# Patient Record
Sex: Female | Born: 1955 | Race: White | Hispanic: No | Smoking: Former smoker
Health system: Southern US, Community
[De-identification: ages and names within clinical notes are randomized; demographics above are authoritative.]

## PROBLEM LIST (undated history)

## (undated) DIAGNOSIS — M1990 Unspecified osteoarthritis, unspecified site: Secondary | ICD-10-CM

## (undated) DIAGNOSIS — F32A Depression, unspecified: Secondary | ICD-10-CM

## (undated) DIAGNOSIS — F419 Anxiety disorder, unspecified: Secondary | ICD-10-CM

## (undated) DIAGNOSIS — C801 Malignant (primary) neoplasm, unspecified: Secondary | ICD-10-CM

## (undated) DIAGNOSIS — Z8669 Personal history of other diseases of the nervous system and sense organs: Secondary | ICD-10-CM

## (undated) DIAGNOSIS — F329 Major depressive disorder, single episode, unspecified: Secondary | ICD-10-CM

## (undated) HISTORY — PX: APPENDECTOMY: SHX54

## (undated) HISTORY — PX: CATARACT EXTRACTION, BILATERAL: SHX1313

## (undated) HISTORY — DX: Major depressive disorder, single episode, unspecified: F32.9

## (undated) HISTORY — DX: Malignant (primary) neoplasm, unspecified: C80.1

## (undated) HISTORY — PX: KIDNEY SURGERY: SHX687

## (undated) HISTORY — DX: Depression, unspecified: F32.A

## (undated) HISTORY — PX: BREAST LUMPECTOMY: SHX2

---

## 1999-02-07 ENCOUNTER — Other Ambulatory Visit: Admission: RE | Admit: 1999-02-07 | Discharge: 1999-02-07 | Payer: Self-pay | Admitting: General Surgery

## 1999-05-29 ENCOUNTER — Ambulatory Visit (HOSPITAL_COMMUNITY): Admission: RE | Admit: 1999-05-29 | Discharge: 1999-05-29 | Payer: Self-pay | Admitting: General Surgery

## 1999-05-29 ENCOUNTER — Encounter (HOSPITAL_BASED_OUTPATIENT_CLINIC_OR_DEPARTMENT_OTHER): Payer: Self-pay | Admitting: General Surgery

## 1999-07-03 DIAGNOSIS — C801 Malignant (primary) neoplasm, unspecified: Secondary | ICD-10-CM

## 1999-07-03 HISTORY — DX: Malignant (primary) neoplasm, unspecified: C80.1

## 1999-07-03 HISTORY — PX: BREAST SURGERY: SHX581

## 1999-07-24 ENCOUNTER — Other Ambulatory Visit: Admission: RE | Admit: 1999-07-24 | Discharge: 1999-07-24 | Payer: Self-pay | Admitting: Obstetrics and Gynecology

## 1999-07-25 ENCOUNTER — Other Ambulatory Visit: Admission: RE | Admit: 1999-07-25 | Discharge: 1999-07-25 | Payer: Self-pay | Admitting: Obstetrics and Gynecology

## 1999-07-25 ENCOUNTER — Encounter (INDEPENDENT_AMBULATORY_CARE_PROVIDER_SITE_OTHER): Payer: Self-pay | Admitting: Specialist

## 2000-04-01 ENCOUNTER — Other Ambulatory Visit: Admission: RE | Admit: 2000-04-01 | Discharge: 2000-04-01 | Payer: Self-pay | Admitting: General Surgery

## 2000-04-01 ENCOUNTER — Encounter (HOSPITAL_BASED_OUTPATIENT_CLINIC_OR_DEPARTMENT_OTHER): Payer: Self-pay | Admitting: General Surgery

## 2000-04-01 ENCOUNTER — Encounter (INDEPENDENT_AMBULATORY_CARE_PROVIDER_SITE_OTHER): Payer: Self-pay | Admitting: *Deleted

## 2000-04-01 ENCOUNTER — Encounter: Admission: RE | Admit: 2000-04-01 | Discharge: 2000-04-01 | Payer: Self-pay | Admitting: General Surgery

## 2000-04-03 DIAGNOSIS — C50811 Malignant neoplasm of overlapping sites of right female breast: Secondary | ICD-10-CM | POA: Insufficient documentation

## 2000-04-08 ENCOUNTER — Encounter (HOSPITAL_BASED_OUTPATIENT_CLINIC_OR_DEPARTMENT_OTHER): Payer: Self-pay | Admitting: General Surgery

## 2000-04-09 ENCOUNTER — Encounter (INDEPENDENT_AMBULATORY_CARE_PROVIDER_SITE_OTHER): Payer: Self-pay | Admitting: Specialist

## 2000-04-09 ENCOUNTER — Ambulatory Visit (HOSPITAL_COMMUNITY): Admission: RE | Admit: 2000-04-09 | Discharge: 2000-04-09 | Payer: Self-pay | Admitting: General Surgery

## 2000-04-09 ENCOUNTER — Encounter (HOSPITAL_BASED_OUTPATIENT_CLINIC_OR_DEPARTMENT_OTHER): Payer: Self-pay | Admitting: General Surgery

## 2000-04-30 ENCOUNTER — Ambulatory Visit (HOSPITAL_COMMUNITY): Admission: RE | Admit: 2000-04-30 | Discharge: 2000-04-30 | Payer: Self-pay | Admitting: General Surgery

## 2000-04-30 ENCOUNTER — Encounter (INDEPENDENT_AMBULATORY_CARE_PROVIDER_SITE_OTHER): Payer: Self-pay | Admitting: *Deleted

## 2000-07-04 ENCOUNTER — Encounter: Admission: RE | Admit: 2000-07-04 | Discharge: 2000-10-02 | Payer: Self-pay | Admitting: *Deleted

## 2000-09-11 ENCOUNTER — Other Ambulatory Visit: Admission: RE | Admit: 2000-09-11 | Discharge: 2000-09-11 | Payer: Self-pay | Admitting: Obstetrics and Gynecology

## 2000-10-08 ENCOUNTER — Other Ambulatory Visit: Admission: RE | Admit: 2000-10-08 | Discharge: 2000-10-08 | Payer: Self-pay | Admitting: General Surgery

## 2000-11-26 ENCOUNTER — Other Ambulatory Visit: Admission: RE | Admit: 2000-11-26 | Discharge: 2000-11-26 | Payer: Self-pay | Admitting: Obstetrics and Gynecology

## 2000-11-26 ENCOUNTER — Encounter (INDEPENDENT_AMBULATORY_CARE_PROVIDER_SITE_OTHER): Payer: Self-pay | Admitting: Specialist

## 2001-01-01 ENCOUNTER — Encounter (INDEPENDENT_AMBULATORY_CARE_PROVIDER_SITE_OTHER): Payer: Self-pay | Admitting: Specialist

## 2001-01-01 ENCOUNTER — Ambulatory Visit (HOSPITAL_COMMUNITY): Admission: RE | Admit: 2001-01-01 | Discharge: 2001-01-01 | Payer: Self-pay | Admitting: Obstetrics and Gynecology

## 2001-04-03 ENCOUNTER — Encounter: Payer: Self-pay | Admitting: Oncology

## 2001-04-03 ENCOUNTER — Encounter: Admission: RE | Admit: 2001-04-03 | Discharge: 2001-04-03 | Payer: Self-pay | Admitting: Oncology

## 2001-06-12 ENCOUNTER — Other Ambulatory Visit: Admission: RE | Admit: 2001-06-12 | Discharge: 2001-06-12 | Payer: Self-pay | Admitting: Family Medicine

## 2001-06-30 ENCOUNTER — Encounter: Admission: RE | Admit: 2001-06-30 | Discharge: 2001-06-30 | Payer: Self-pay | Admitting: General Surgery

## 2001-06-30 ENCOUNTER — Encounter (HOSPITAL_BASED_OUTPATIENT_CLINIC_OR_DEPARTMENT_OTHER): Payer: Self-pay | Admitting: General Surgery

## 2001-06-30 ENCOUNTER — Encounter (INDEPENDENT_AMBULATORY_CARE_PROVIDER_SITE_OTHER): Payer: Self-pay | Admitting: *Deleted

## 2001-07-21 ENCOUNTER — Encounter: Admission: RE | Admit: 2001-07-21 | Discharge: 2001-07-21 | Payer: Self-pay | Admitting: General Surgery

## 2001-07-21 ENCOUNTER — Encounter (HOSPITAL_BASED_OUTPATIENT_CLINIC_OR_DEPARTMENT_OTHER): Payer: Self-pay | Admitting: General Surgery

## 2001-08-18 ENCOUNTER — Encounter (HOSPITAL_BASED_OUTPATIENT_CLINIC_OR_DEPARTMENT_OTHER): Payer: Self-pay | Admitting: General Surgery

## 2001-08-18 ENCOUNTER — Encounter: Admission: RE | Admit: 2001-08-18 | Discharge: 2001-08-18 | Payer: Self-pay | Admitting: General Surgery

## 2002-04-07 ENCOUNTER — Encounter: Payer: Self-pay | Admitting: Oncology

## 2002-04-07 ENCOUNTER — Encounter: Admission: RE | Admit: 2002-04-07 | Discharge: 2002-04-07 | Payer: Self-pay | Admitting: Oncology

## 2002-11-16 ENCOUNTER — Ambulatory Visit (HOSPITAL_COMMUNITY): Admission: RE | Admit: 2002-11-16 | Discharge: 2002-11-16 | Payer: Self-pay

## 2003-03-12 ENCOUNTER — Encounter: Admission: RE | Admit: 2003-03-12 | Discharge: 2003-03-12 | Payer: Self-pay | Admitting: Oncology

## 2003-03-19 ENCOUNTER — Encounter: Payer: Self-pay | Admitting: Oncology

## 2003-03-19 ENCOUNTER — Encounter: Admission: RE | Admit: 2003-03-19 | Discharge: 2003-03-19 | Payer: Self-pay | Admitting: Oncology

## 2003-03-30 ENCOUNTER — Encounter: Payer: Self-pay | Admitting: Radiation Oncology

## 2003-03-30 ENCOUNTER — Ambulatory Visit (HOSPITAL_COMMUNITY): Admission: RE | Admit: 2003-03-30 | Discharge: 2003-03-30 | Payer: Self-pay | Admitting: Radiation Oncology

## 2003-04-09 ENCOUNTER — Encounter: Admission: RE | Admit: 2003-04-09 | Discharge: 2003-04-09 | Payer: Self-pay | Admitting: Oncology

## 2003-04-09 ENCOUNTER — Encounter: Payer: Self-pay | Admitting: Oncology

## 2003-06-30 ENCOUNTER — Other Ambulatory Visit: Admission: RE | Admit: 2003-06-30 | Discharge: 2003-06-30 | Payer: Self-pay | Admitting: Obstetrics and Gynecology

## 2003-07-03 HISTORY — PX: ABDOMINAL HYSTERECTOMY: SHX81

## 2003-07-03 HISTORY — PX: OOPHORECTOMY: SHX86

## 2004-03-30 ENCOUNTER — Ambulatory Visit: Admission: RE | Admit: 2004-03-30 | Discharge: 2004-03-30 | Payer: Self-pay | Admitting: Radiation Oncology

## 2004-04-10 ENCOUNTER — Encounter: Admission: RE | Admit: 2004-04-10 | Discharge: 2004-04-10 | Payer: Self-pay | Admitting: Oncology

## 2004-04-11 ENCOUNTER — Ambulatory Visit (HOSPITAL_COMMUNITY): Admission: RE | Admit: 2004-04-11 | Discharge: 2004-04-11 | Payer: Self-pay | Admitting: Oncology

## 2004-04-14 ENCOUNTER — Encounter: Admission: RE | Admit: 2004-04-14 | Discharge: 2004-04-14 | Payer: Self-pay | Admitting: Oncology

## 2004-04-19 ENCOUNTER — Ambulatory Visit (HOSPITAL_COMMUNITY): Admission: RE | Admit: 2004-04-19 | Discharge: 2004-04-19 | Payer: Self-pay | Admitting: Oncology

## 2004-04-20 ENCOUNTER — Emergency Department (HOSPITAL_COMMUNITY): Admission: EM | Admit: 2004-04-20 | Discharge: 2004-04-20 | Payer: Self-pay | Admitting: Emergency Medicine

## 2004-04-28 ENCOUNTER — Ambulatory Visit (HOSPITAL_COMMUNITY): Admission: RE | Admit: 2004-04-28 | Discharge: 2004-04-28 | Payer: Self-pay | Admitting: Oncology

## 2004-05-05 ENCOUNTER — Encounter (INDEPENDENT_AMBULATORY_CARE_PROVIDER_SITE_OTHER): Payer: Self-pay | Admitting: *Deleted

## 2004-05-05 ENCOUNTER — Ambulatory Visit (HOSPITAL_COMMUNITY): Admission: RE | Admit: 2004-05-05 | Discharge: 2004-05-05 | Payer: Self-pay | Admitting: Oncology

## 2004-05-12 ENCOUNTER — Ambulatory Visit: Payer: Self-pay | Admitting: Oncology

## 2004-05-17 ENCOUNTER — Ambulatory Visit: Admission: RE | Admit: 2004-05-17 | Discharge: 2004-07-07 | Payer: Self-pay | Admitting: Radiation Oncology

## 2004-08-23 ENCOUNTER — Ambulatory Visit: Payer: Self-pay | Admitting: Oncology

## 2004-10-18 ENCOUNTER — Ambulatory Visit: Payer: Self-pay | Admitting: Oncology

## 2005-04-11 ENCOUNTER — Encounter: Admission: RE | Admit: 2005-04-11 | Discharge: 2005-04-11 | Payer: Self-pay | Admitting: Oncology

## 2005-04-16 ENCOUNTER — Ambulatory Visit: Payer: Self-pay | Admitting: Oncology

## 2005-05-15 ENCOUNTER — Ambulatory Visit (HOSPITAL_COMMUNITY): Admission: RE | Admit: 2005-05-15 | Discharge: 2005-05-15 | Payer: Self-pay | Admitting: Oncology

## 2005-06-01 ENCOUNTER — Ambulatory Visit: Payer: Self-pay | Admitting: Oncology

## 2005-07-17 ENCOUNTER — Encounter: Admission: RE | Admit: 2005-07-17 | Discharge: 2005-07-17 | Payer: Self-pay | Admitting: Oncology

## 2005-10-11 ENCOUNTER — Ambulatory Visit: Payer: Self-pay | Admitting: Oncology

## 2006-01-05 IMAGING — CR DG CHEST 2V
2 series · 2 of 2 positions shown · non-contrast
Comparison: none

CLINICAL DATA: Mid chest pain extending into the back.  History of carcinoma of the breast diagnosed 4 years ago. 
 TWO VIEW CHEST
 PA and lateral views of the chest are made and are compared to previous studies of 04/11/04 and show generalized peribronchial thickening and some hyperaeration of the lungs consistent with chronic obstructive pulmonary disease.  There is no pneumothorax or pleural effusion.  The bones show no evidence of fracture or metastatic disease.  The heart and mediastinum are normal. 
 IMPRESSION
 Mild COPD.  No significant interval change.  No acute disease.

[view not recorded (1 of 2)]
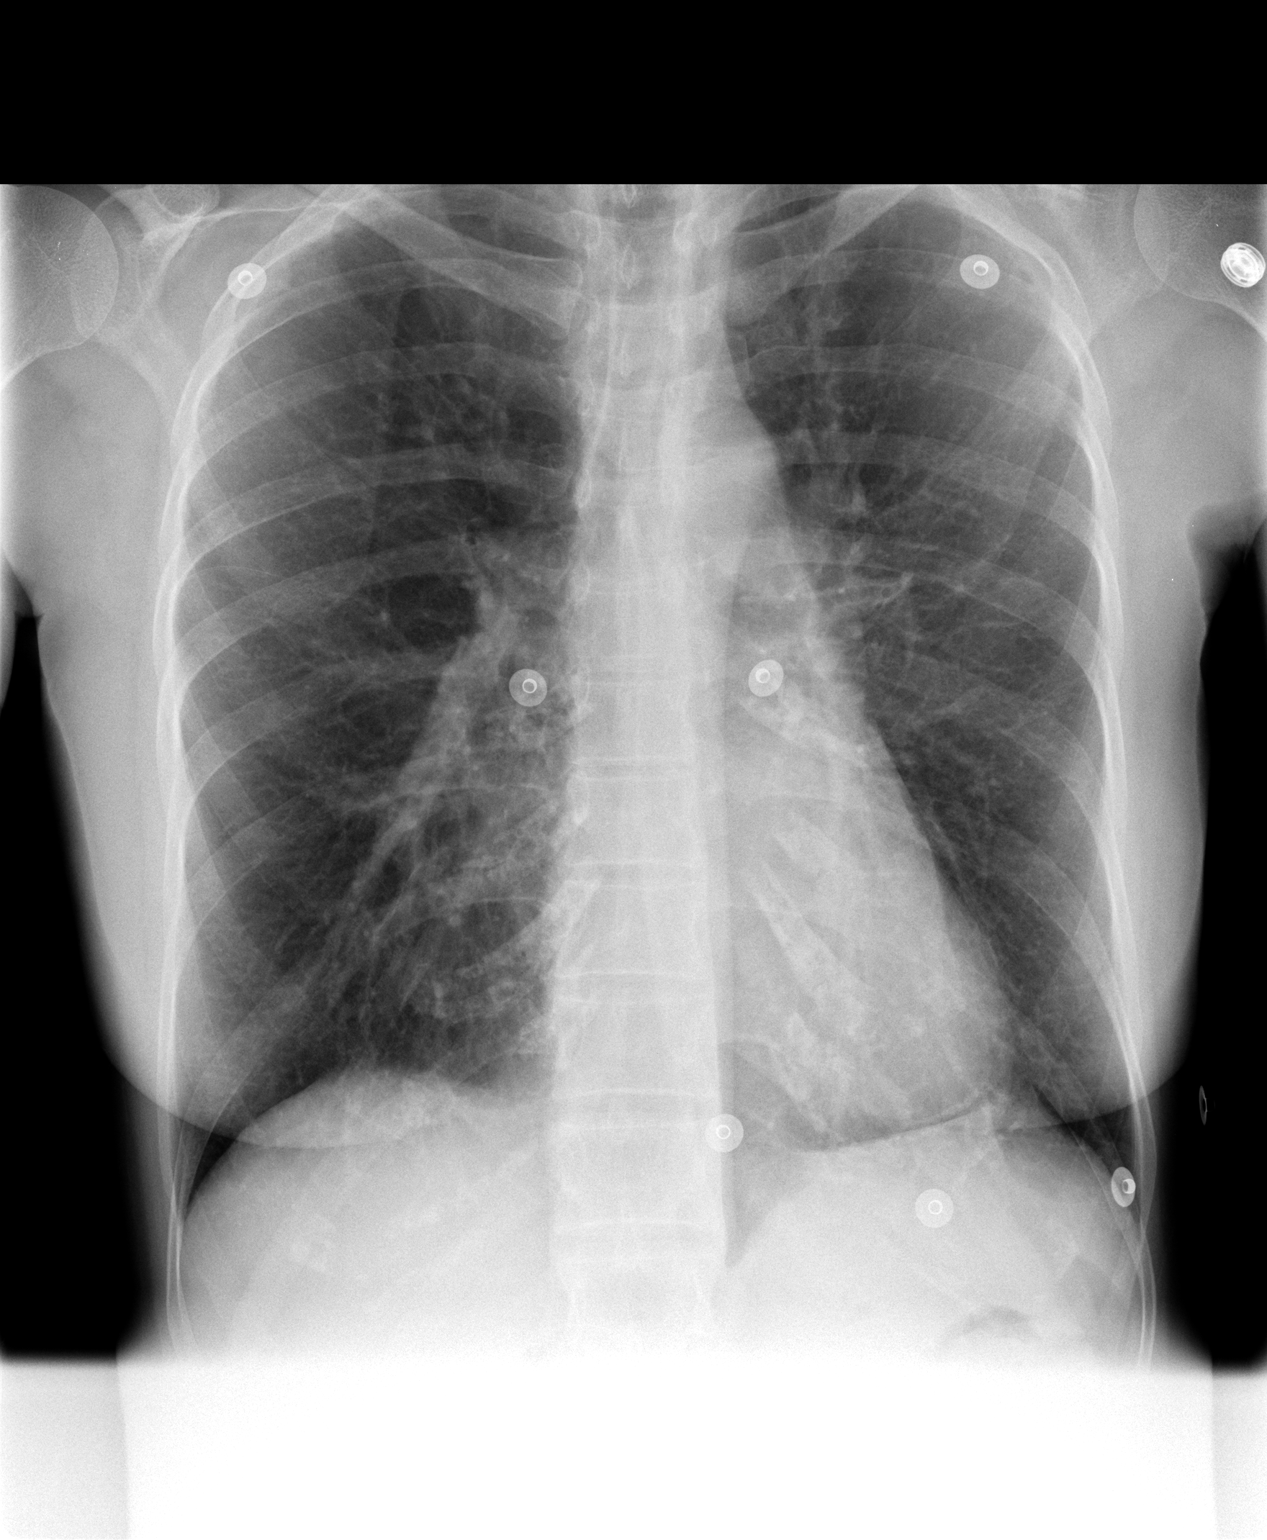

[view not recorded (2 of 2)]
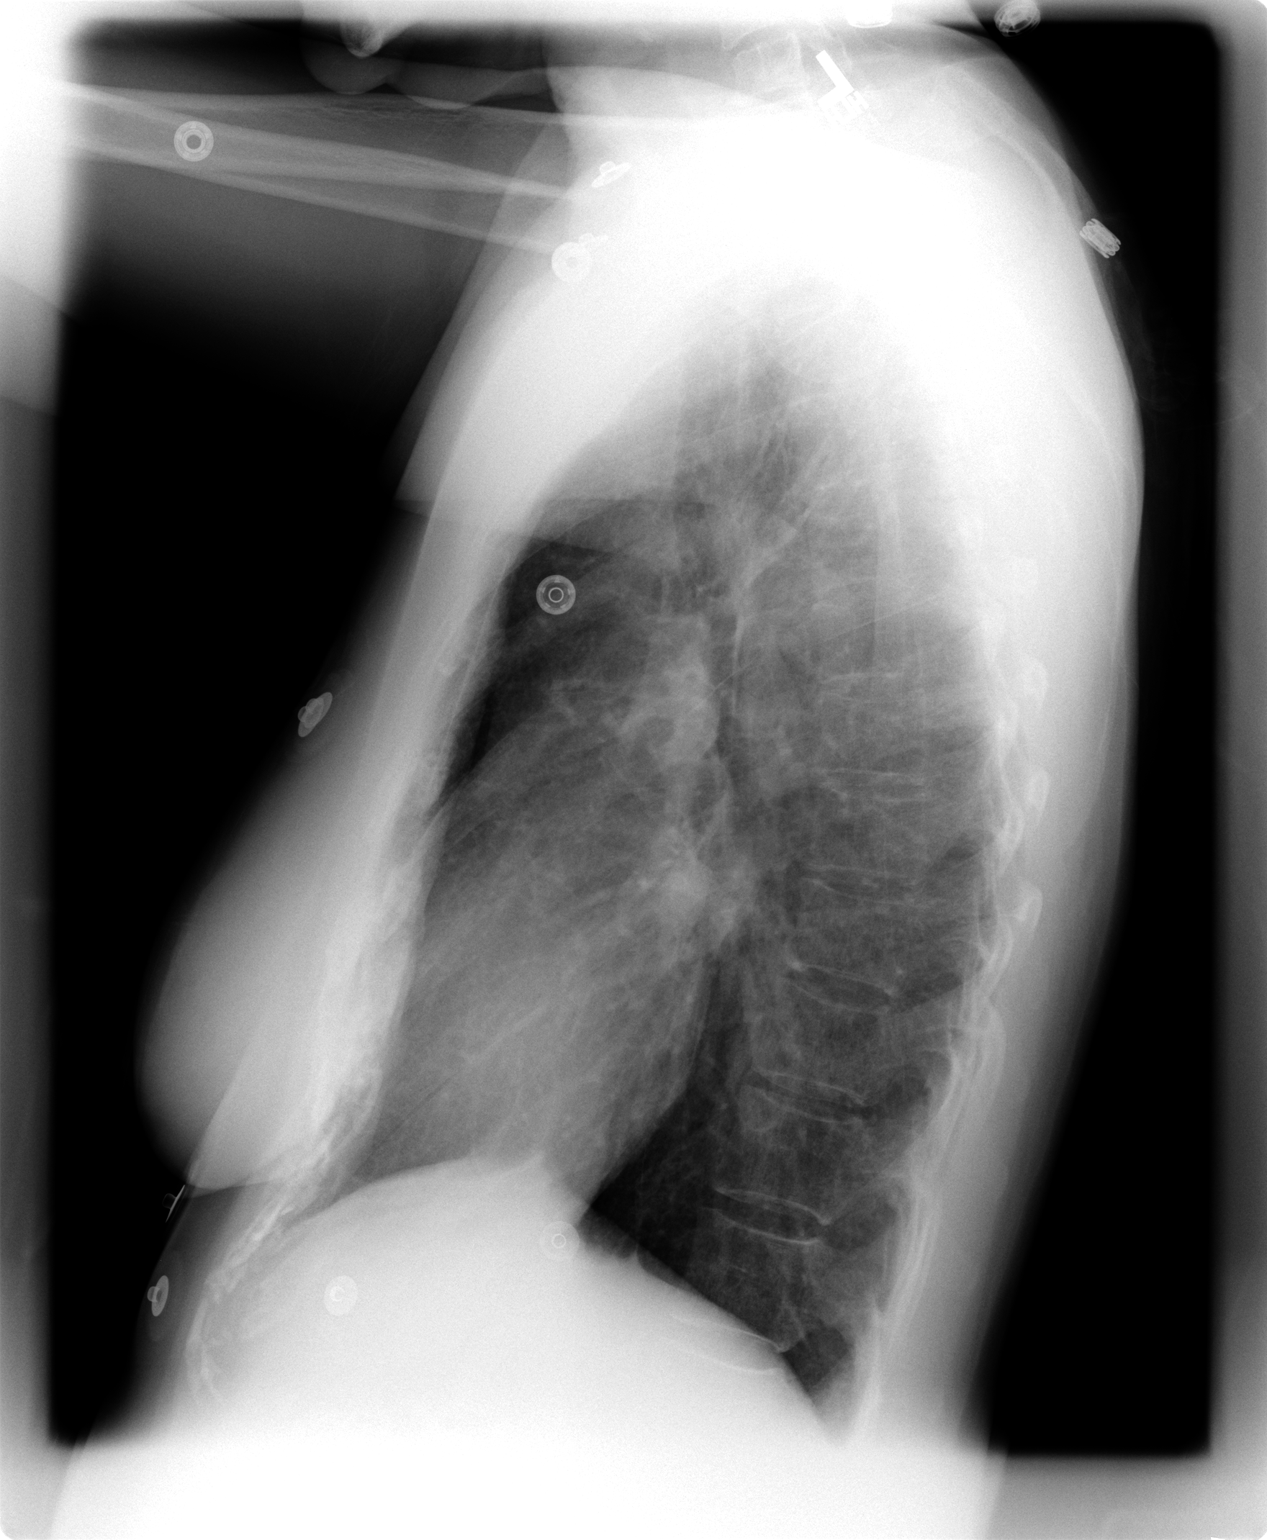

[2 of 2 positions shown; findings below may reference images not displayed]

## 2006-01-09 ENCOUNTER — Ambulatory Visit (HOSPITAL_COMMUNITY): Admission: RE | Admit: 2006-01-09 | Discharge: 2006-01-10 | Payer: Self-pay | Admitting: Obstetrics and Gynecology

## 2006-01-09 ENCOUNTER — Encounter (INDEPENDENT_AMBULATORY_CARE_PROVIDER_SITE_OTHER): Payer: Self-pay | Admitting: Specialist

## 2006-01-13 IMAGING — CT NM PET TUM IMG SKULL BASE T - THIGH
4 series · 25 of 25 positions shown · IV contrast ([ID])
Comparison: No prior PET-CT. The whole-body bone scan 04/14/2004 and the CT chest 04/19/2004 is
correlated.

CLINICAL DATA: History of breast cancer originally diagnosed in 5993, status post lumpectomy and
radiation therapy and chemotherapy at that time. Patient has one month history of left rib pain and
had a bone scan and chest CT suggesting a sclerotic metastasis in the anterior left third rib.
Restaging.

FDG PET-CT TUMOR IMAGING (SKULL BASE TO THIGHS)  04/28/2004:
Fasting Blood Glucose:  136
TECHNIQUE: 17.6 mCi F-18 FDG was injected intravenously via the right antecubital vein . 
Full-ring PET imaging was performed from the skull base through the mid-thighs 40 minutes after
injection.  CT data was obtained and used for attenuation correction and anatomic localization
only.  (This was not acquired as a diagnostic CT examination.)

[Series 1: pet ac · axial · 3.3mm · 4.69mm/px · z∈[-873,-3]mm · 8 of 267 slices shown]
[im 1/267]
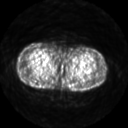
[im 39/267]
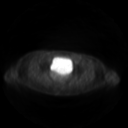
[im 77/267]
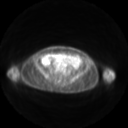
[im 115/267]
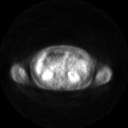
[im 153/267]
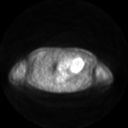
[im 191/267]
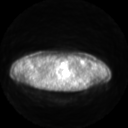
[im 229/267]
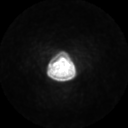
[im 267/267]
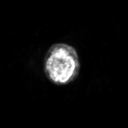

[Series 2: ct images · axial · 3.8mm · 0.98mm/px · z∈[-873,-4]mm · 8 of 267 slices shown]
[im 1/267]
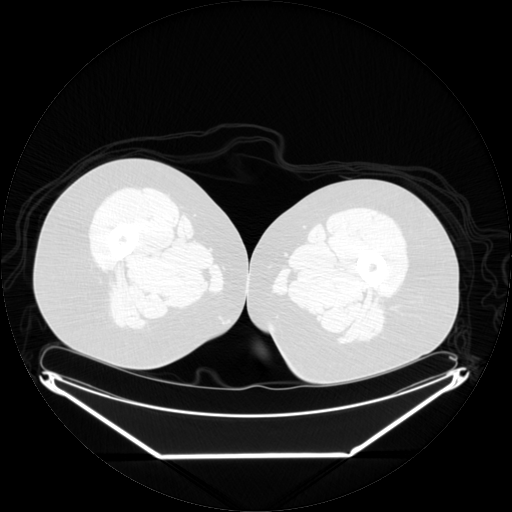
[im 39/267]
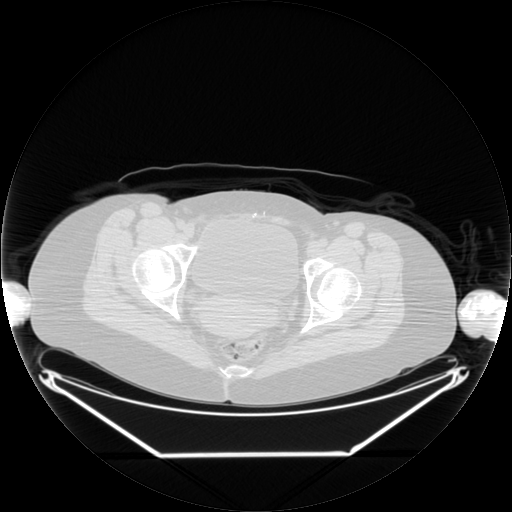
[im 77/267]
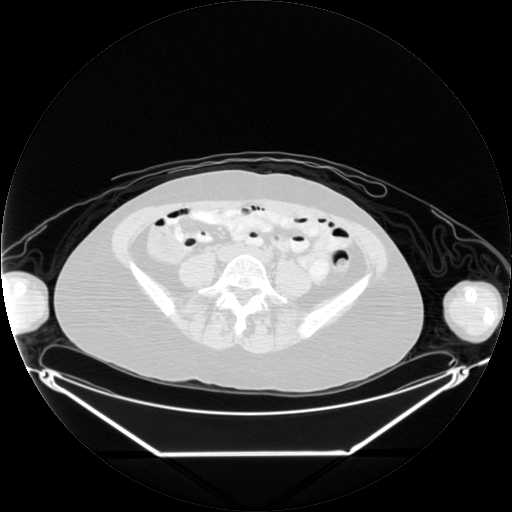
[im 115/267]
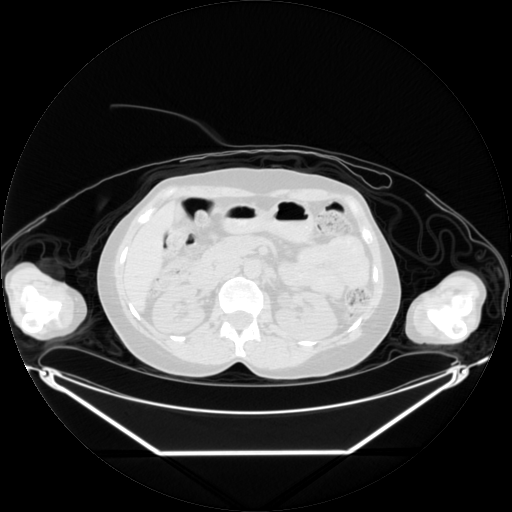
[im 153/267]
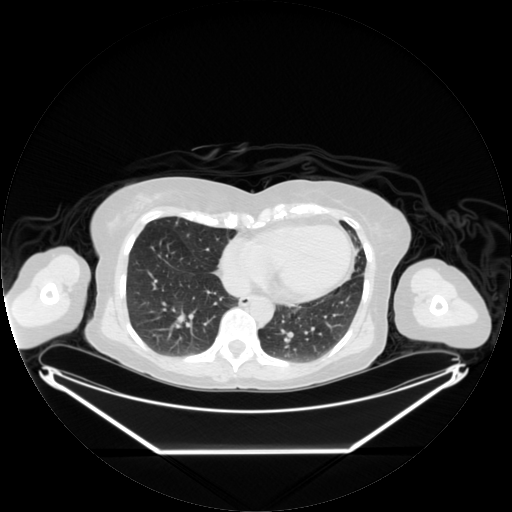
[im 191/267]
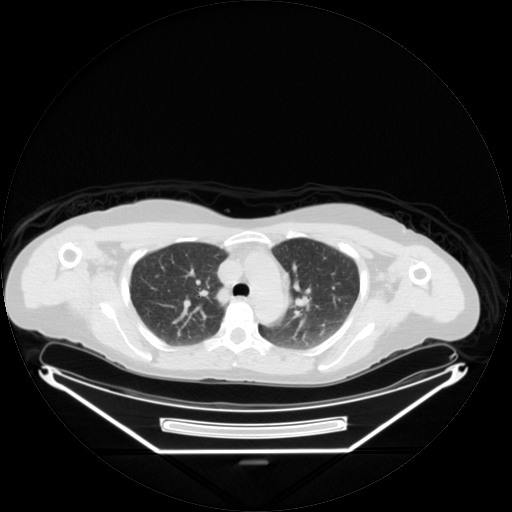
[im 229/267]
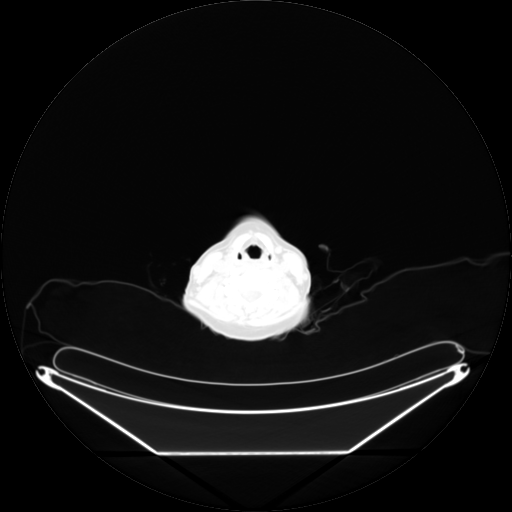
[im 267/267  brain]
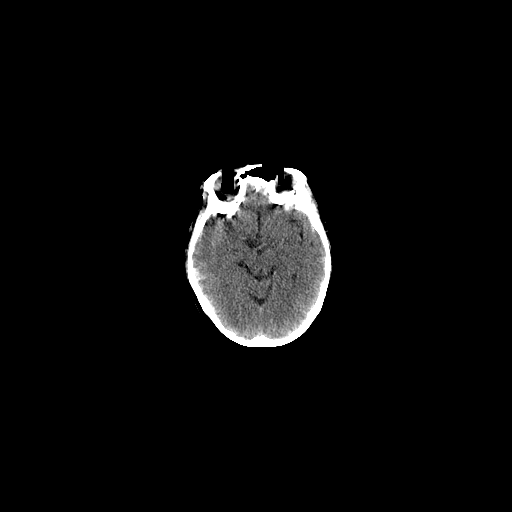

[Series 2: pet nac · axial · 3.3mm · 4.69mm/px · z∈[-873,-3]mm · 8 of 267 slices shown]
[im 1/267]
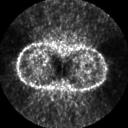
[im 39/267]
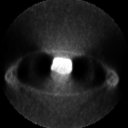
[im 77/267]
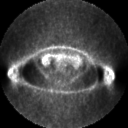
[im 115/267]
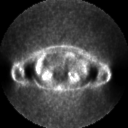
[im 153/267]
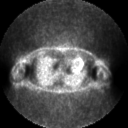
[im 191/267]
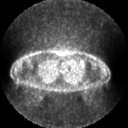
[im 229/267]
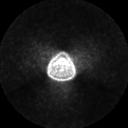
[im 267/267]
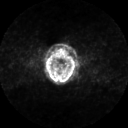

[Series 101: average · axial · 3.3mm · 1.17mm/px · 1 of 2 slices shown]
[im 1/2]
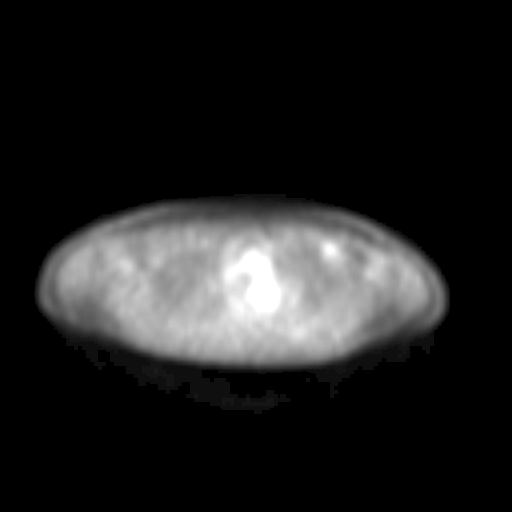

[25 of 25 positions shown; findings below may reference images not displayed]

FINDINGS: There is a focus of mildly increased metabolic activity which in the localizes to the
anterior left third rib. This has a maximum SUV of 2.6 g/ml. On the correlative CT, there is now
evidence for subtle subpleural soft tissue on this CT examination which was not seen on the
diagnostic CT 17 days ago.

No other abnormal metabolic activity is identified in the neck, chest, abdomen or pelvis to suggest
metastatic disease elsewhere. There is activity present within the tongue which is probably related
either to phonation or to swallowing. Physiologic activity is present Waldeyer's ring. Physiologic
activity is also present in the left ventricle and in the luminal contents of the large and small
bowel. There is physiologic excretion into the urinary tract.

IMPRESSION

1. Mildly increased metabolic activity which localizes to the sclerotic lesion in the anterior left
third rib. There is now evidence of associated subpleural soft tissue which was not seen on the
diagnostic CT 17 days ago. This is consistent with a metastasis, and its low SUV suggests that it
is low-grade and/or slow growing.

2. No evidence of recurrent or metastatic disease elsewhere.

## 2006-01-22 ENCOUNTER — Ambulatory Visit (HOSPITAL_COMMUNITY): Admission: RE | Admit: 2006-01-22 | Discharge: 2006-01-22 | Payer: Self-pay | Admitting: Obstetrics and Gynecology

## 2006-04-12 ENCOUNTER — Encounter: Admission: RE | Admit: 2006-04-12 | Discharge: 2006-04-12 | Payer: Self-pay | Admitting: Oncology

## 2006-04-16 ENCOUNTER — Ambulatory Visit: Payer: Self-pay | Admitting: Oncology

## 2006-07-02 HISTORY — PX: SHOULDER SURGERY: SHX246

## 2006-12-02 ENCOUNTER — Ambulatory Visit: Payer: Self-pay | Admitting: Oncology

## 2006-12-10 ENCOUNTER — Encounter: Admission: RE | Admit: 2006-12-10 | Discharge: 2006-12-10 | Payer: Self-pay | Admitting: Oncology

## 2007-02-01 ENCOUNTER — Emergency Department (HOSPITAL_COMMUNITY): Admission: EM | Admit: 2007-02-01 | Discharge: 2007-02-01 | Payer: Self-pay | Admitting: Emergency Medicine

## 2007-04-14 ENCOUNTER — Encounter: Admission: RE | Admit: 2007-04-14 | Discharge: 2007-04-14 | Payer: Self-pay | Admitting: Oncology

## 2007-04-17 ENCOUNTER — Ambulatory Visit: Payer: Self-pay | Admitting: Oncology

## 2007-11-13 ENCOUNTER — Ambulatory Visit: Payer: Self-pay | Admitting: Oncology

## 2008-04-14 ENCOUNTER — Encounter: Admission: RE | Admit: 2008-04-14 | Discharge: 2008-04-14 | Payer: Self-pay | Admitting: Oncology

## 2008-04-30 ENCOUNTER — Ambulatory Visit: Payer: Self-pay | Admitting: Oncology

## 2008-07-19 ENCOUNTER — Encounter: Admission: RE | Admit: 2008-07-19 | Discharge: 2008-07-19 | Payer: Self-pay | Admitting: Oncology

## 2008-11-20 ENCOUNTER — Emergency Department: Payer: Self-pay

## 2008-11-21 ENCOUNTER — Inpatient Hospital Stay (HOSPITAL_COMMUNITY): Admission: EM | Admit: 2008-11-21 | Discharge: 2008-11-23 | Payer: Self-pay | Admitting: Emergency Medicine

## 2009-01-14 ENCOUNTER — Encounter: Admission: RE | Admit: 2009-01-14 | Discharge: 2009-01-14 | Payer: Self-pay | Admitting: Orthopedic Surgery

## 2009-04-15 ENCOUNTER — Encounter: Admission: RE | Admit: 2009-04-15 | Discharge: 2009-04-15 | Payer: Self-pay | Admitting: Oncology

## 2010-04-17 ENCOUNTER — Encounter: Admission: RE | Admit: 2010-04-17 | Discharge: 2010-04-17 | Payer: Self-pay | Admitting: Oncology

## 2010-10-10 LAB — CULTURE, BLOOD (SINGLE): Culture: NO GROWTH

## 2010-10-10 LAB — URINALYSIS, ROUTINE W REFLEX MICROSCOPIC
Bilirubin Urine: NEGATIVE
Ketones, ur: NEGATIVE mg/dL
Nitrite: NEGATIVE
Protein, ur: NEGATIVE mg/dL
Specific Gravity, Urine: 1.007 (ref 1.005–1.030)
Urobilinogen, UA: 0.2 mg/dL (ref 0.0–1.0)

## 2010-10-10 LAB — CBC
Platelets: 113 10*3/uL — ABNORMAL LOW (ref 150–400)
RBC: 3.64 MIL/uL — ABNORMAL LOW (ref 3.87–5.11)
WBC: 4.8 10*3/uL (ref 4.0–10.5)

## 2010-10-10 LAB — GLUCOSE, CAPILLARY: Glucose-Capillary: 97 mg/dL (ref 70–99)

## 2010-10-10 LAB — URINE MICROSCOPIC-ADD ON

## 2010-10-25 ENCOUNTER — Ambulatory Visit: Payer: Self-pay | Admitting: Family Medicine

## 2010-11-14 NOTE — Consult Note (Signed)
NAMEMAKINLEE, AWWAD                ACCOUNT NO.:  [id number]   MEDICAL RECORD NO.:  [id number]          PATIENT TYPE:  INP   LOCATION:  3009                         FACILITY:  [facility]   PHYSICIAN:  Stefani Dama, M.D.  DATE OF BIRTH:  [birth year]   DATE OF CONSULTATION:  11/21/2008  DATE OF DISCHARGE:                                 CONSULTATION   REQUESTING PHYSICIAN:  Wilmon Arms. Tsuei, MD.   REASON FOR REQUEST:  C-spine injury.   HISTORY OF PRESENT ILLNESS:  Bridgid Printz is a 55 year old right-handed  female who was thrown from a horse yesterday.  She landed flat onto her  buttocks, breaking her sacrum and also causing some localized neck pain  and head pain.  She underwent a CT scan of her neck and her sacrum at  [facility] and was transferred here for further  definitive care.  The patient noted that when she attempted to raise her  arms up above her head, she had numbness in both distal upper  extremities.  An MRI was attempted here and this demonstrated the  patient had diffuse spondylosis at C5-6 and C6-7 with some mild  foraminal stenosis, but no evidence of any acute injury in the cervical  spine.  I was asked to see the patient regarding her symptoms and  further management and treatment.   Past medical history is unremarkable for any significant medical  problems.  The patient enjoys riding horses vigorously.  She denies any  previous episodes like this.  Her physical examination at the current  time reveals that she can move her neck 15 degrees to the left and to  the right.  She has a considerable amount of localized neck stiffness.  Her motor strength, however, is good in the deltoids, biceps, triceps,  grips, and intrinsic's with normal tone and bulk.  Reflexes are trace in  the biceps and triceps, absent in the patellae, 1+ in the Achilles.  Babinski's is downgoing.  Her sensation appears intact in the upper and  lower extremities.  Lower  extremity strength confrontationally is within  limits of normal.   Review of the patient's CT scan demonstrates that she has spondylosis at  C5-6 and C6-7.  This correlates well with the findings of the MRI.  No  acute injury such as a fracture is noted.  However, the patient could  indeed have an early ligamentous sprain that is aggravating her  symptoms.  I advised the patient that simple bedrest and minimization of  activity should help with this process.  While reviewing the CT scan, I  did review the CT of the pelvis and sacrum particularly and I was not  able to identify a discrete fracture of the sacrum.  I have advised that  the patient be placed in a soft cervical collar at the current time, and  thereafter, we will obtain some flexion/extension films.  I believe that  her symptoms should resolve spontaneously and if not, we will be  following her up as an outpatient for further discussion of any specific  intervention, likely  a repeat MRI of the cervical spine.      Stefani Dama, M.D.  Electronically Signed     HJE/MEDQ  D:  11/21/2008  T:  11/22/2008  Job:  [id number]

## 2010-11-14 NOTE — Consult Note (Signed)
[name], [name]                ACCOUNT NO.:  [id number]   MEDICAL RECORD NO.:  [id number]          PATIENT TYPE:  INP   LOCATION:  3009                         FACILITY:  [facility]   PHYSICIAN:  Doralee Albino. Carola Frost, M.D. DATE OF BIRTH:  1956/04/05   DATE OF CONSULTATION:  DATE OF DISCHARGE:                                 CONSULTATION   REQUESTING PHYSICIAN:  Sandria Bales. Ezzard Standing, MD, Trauma Service, and Earney Hamburg, PA-C.   REASON FOR CONSULTATION:  Sacral fracture.   BRIEF HISTORY AND PRESENTATION:  Megan Rollins is a 55 year old female  who was riding a horseback when at a reasonable race speed when the  horse came to an abrupt stop catapulting over the head where she landed,  flat on her back with some trauma to the neck as well.  She denies loss  of consciousness, but had a severe pain in her lower back and was found  to have a sacral fracture at [place].  She also reported numbness in  both upper extremities when she elevates her arms above her head.  She  was transported on a spine board and C-collar [facility] for further  evaluation and management.  She denies any numbness or tingling in her  lower extremities and continues to complain of soreness both in the  anterior pelvis and posterior.   PAST MEDICAL HISTORY:  Notable for left breast cancer treated by Dr.  Mancel Bale, also restless leg syndrome.   PAST SURGICAL HISTORY:  Left lumpectomy and sentinel lymph node biopsy.   SOCIAL HISTORY:  No drugs.  She does smoke.  She does not drink alcohol.  She is married.   ALLERGIES:  PENICILLIN, SULFA, and also a teaching with narcotics.   MEDICATIONS:  None.   REVIEW OF SYSTEMS:  Review of the patient including chart otherwise  negative.   PHYSICAL EXAMINATION:  The patient is alert, oriented, appropriate for  stated age not in acute distress.  Afebrile.  Stable vitals signs.  Shoulders, elbows, wrists without focal scratches, ecchymosis, crepitus,  or block to motion.   She has excellent strength, intact axillary radial,  median, ulnar, sensory and motor as well as 2+ radial pulse bilaterally.  The pelvic girdle is tender to compression, but not excessively, so she  has tenderness along the posterior sacrum as well as the anterior rami,  but not directly anterior along the symphysis pubis.  No marks or  ecchymosis.  The hips have no block to motion.  No pain with internal or  external rotation.  Knee motion was confounded by pain in the back, but  could easily obtain 0 to 90.  Distally, full motion of the ankles.  Intact DP, SP, TN sensory and motor function.  DP and PT pulses are each  2+.  The patient was unable to perform a straight leg raise secondary to  back pain.   X-RAYS:  AP and inlet and lateral x-rays of the pelvis did not clearly  delineate the significant displacement of a fracture nor dislocation.  There is some abnormality about the SI joint, but it  appears to be  largely symmetric.  I do not see a fracture involving the femoral head  or neck on the films.   ASSESSMENT:  Sacral fracture without significant displacement.   PLAN:  Ms. Agent will be weightbearing as tolerated with PT and OT.  As  she mobilizes, we will repeat AP and inlet and outlet x-rays to ensure  she is maintaining reduction.      Doralee Albino. Carola Frost, M.D.  Electronically Signed     MHH/MEDQ  D:  11/21/2008  T:  11/21/2008  Job:  [id number]

## 2010-11-14 NOTE — Discharge Summary (Signed)
Megan Rollins, Megan Rollins                ACCOUNT NO.:  [id number]   MEDICAL RECORD NO.:  [id number]           PATIENT TYPE:   LOCATION:                                 FACILITY:   PHYSICIAN:  Gabrielle Dare. Janee Morn, M.D.DATE OF BIRTH:  1955-11-19   DATE OF ADMISSION:  11/21/2008  DATE OF DISCHARGE:  11/23/2008                               DISCHARGE SUMMARY   ADMITTING TRAUMA SURGEON:  Dr. Corliss Skains   CONSULTANTS:  Dr. Danielle Dess, neurosurgery and Dr. Carola Frost, Orthopedic  Surgery.   DISCHARGE DIAGNOSES:  1. Thrown from a horse.  2. Minimal fracture, left sacroiliac joint.  3. Cervical strain with some degenerative C-spine changes noted.  4. History of breast carcinoma.  5. History of restless leg syndrome.   HISTORY ON ADMISSION:  This is a 55 year old right-handed female who was  apparently thrown from a horse on Nov 20, 2008.  Apparently, she was  thrown over the horse and landed flat on her buttocks.  She experienced  some neck, low back, and sacral pain, and was evaluated at [facility] and reportedly found to have a sacral fracture.  She was referred to [facility] for further evaluation and treatment.   She underwent MRI scanning of the cervical spine here, although the scan  was somewhat limited due to motion artifact.  The patient did have some  diffuse spondylosis at C5-6 and C6-7 with some mild foraminal stenosis  but, no evidence for acute injury of the cervical spine.  She had this  done secondary to some persistent neck pain, stiffness, as well as some  complaints of numbness and tingling in the upper extremities.  She was  evaluated by Dr. Danielle Dess and it was felt she should be treated  conservatively.  At this point, she does not have any persistent  numbness and tingling in the upper extremities.  Her neck pain is  essentially improved and she will follow up with Dr. Danielle Dess on an as-  needed basis should she have any questions or recurrent numbness,  tingling,  or pain.   She was seen by Dr. Myrene Galas, Orthopedic Surgery for her sacral  fracture, which was felt to be very minimal and apparently seen on CT  only from [place].  She was mobilized with physical therapy.  She had  followup radiographs, which showed some minimal abnormality about the  left SI joint, but no clear-cut fractures.  She had followup films done  following mobilization with therapies and this showed no clear evidence  of a fracture dislocation.  It was felt that if the patient was felt to  have an occult fracture, then an MRI might more clearly delineate this.  Functionally; however, the patient was making progress and was  ambulating, weightbearing as tolerated with a walker.   At this time, the patient is independent with ambulation with her walker  and tolerating regular diet, and feels ready to go home.  She is  discharged home with the assistance of family.   She will follow up with Dr. Carola Frost in 1-2 weeks, follow up with Dr.  Elsner as needed,  follow up with Trauma Service on an as-needed basis.   MEDICATIONS:  1. Percocet 5/325 one-two p.o. q.4 h. p.r.n. pain, #60, no refill.  2. Colace as needed.  3. Benadryl 25-50 mg p.o. q.6 h. p.r.n. itching, this has been quite a      problem for the patient with narcotics, but she is aware that this      is a common side effect with this and she will continue to utilize      the Benadryl on an as-needed basis.   At this time again, the patient is discharged home.      Shawn Rayburn, P.A.      Gabrielle Dare Janee Morn, M.D.  Electronically Signed    SR/MEDQ  D:  11/23/2008  T:  11/24/2008  Job:  [id number]   cc:   Doralee Albino. Carola Frost, M.D.  Stefani Dama, M.D.  [facility]

## 2010-11-17 NOTE — Op Note (Signed)
[facility]  Patient:    Megan Rollins, Megan Rollins                       MRN: [id number] Proc. Date: 01/01/01 Adm. Date:  [id number] Attending:  Lenoard Aden                           Operative Report  PREOPERATIVE DIAGNOSES:       1. Postmenopausal bleeding.                               2. Breast cancer on tamoxifen.  POSTOPERATIVE DIAGNOSES:      1. Postmenopausal bleeding.                               2. Breast cancer on tamoxifen.  PROCEDURE:                    Diagnostic hysteroscopy, dilatation and                               curettage, resectoscopic polypectomy.  SURGEON:                      Lenoard Aden, M.D.  ANESTHESIA:                   General.  ESTIMATED BLOOD LOSS:         Less than 50 cc.  COMPLICATIONS:                None.  FLUID DEFICIT:                40 cc.  FINDINGS:                     Left lateral wall sessile polyp, otherwise normal endometrial cavity. Normal tubal ostia noted.  COUNTS:                       Correct.  DISPOSITION:                  Patient to recovery in good condition.  DESCRIPTION OF PROCEDURE:     After being apprised of the risks of anesthesia, infection, bleeding, uterine perforation with injury to abdominal organs with need for repair, delayed versus immediate complications to include bowel and bladder injury, the patient is brought to the operating room where she is administered general anesthetic, prepped and draped in the usual sterile fashion, catheterized until the bladder is empty. Examination under anesthesia reveals a small anteflexed uterus and no adnexal masses. Dilute Pitressin solution placed at 3 and 9 oclock to the cervicovaginal junction, 16 cc total. A single-tooth tenaculum used to grasp the anterior lip of the cervix. Uterus easily dilated up to a #31 Pratt dilator. Diagnostic hysteroscope placed. Visualization reveals a small sessile polyp along the left lateral wall.  This was resected using the right angle double loop without difficulty. Good hemostasis noted. D&C is obtained; otherwise normal cavity, normal tubal ostia noted. Specimen is obtained. D&C performed using small serrated curets. Revisualization reveals normal cavity, no evidence of perforation, normal tubal ostia. Polyp is completely excised. At this time, all instruments are removed.  Fluid deficit 40 cc noted. Patient tolerates procedure well and transferred to recovery in good condition. DD:  01/01/01 TD:  01/01/01 Job: [id number]

## 2010-11-17 NOTE — Op Note (Signed)
[place]  Patient:    Megan Rollins, Megan Rollins                       MRN: [id number] Proc. Date: 04/09/00 Adm. Date:  [id number] Disc. Date: [id number] Attending:  Sonda Primes CC:         Mardene Celeste. Lurene Shadow, M.D.   Operative Report  PREOPERATIVE DIAGNOSIS:  Carcinoma of left breast.  POSTOPERATIVE DIAGNOSIS:  Carcinoma of left breast.  OPERATION PERFORMED:  Lumpectomy with lymphatic mapping and sentinel lymph node biopsy.  SURGEON:  Mardene Celeste. Lurene Shadow, M.D.  ASSISTANT:  Nurse.  ANESTHESIA:  General.  INDICATIONS FOR PROCEDURE:  The patient is a 55 year old woman who is status post  core biopsy of a left breast lesion seen on mammogram which showed infiltrating intraductal carcinoma.  She was brought to the operating room now following needle localization and radionuclide injection for lumpectomy and sentinel lymph node mapping with sentinel lymph node dissection.  DESCRIPTION OF PROCEDURE:  Following induction of satisfactory general anesthesia with the patient positioned supinely, I injected 5 cc of Lymphazurin blue dye to the region surrounding the mass. I then massaged this for approximately 10 minutes.  The breast was then prepped and draped to be included in a sterile operative field.  An elliptical incision was made over the region of concern.  It was then deepened through the skin and subcutaneous tissues.  Following the localizing wire down to the region of the mass.  The mass was dissected free on all sides.  The posterior wall of the mass was the anterior rectus fascia.  This lesion was sent down for specimen mammography which showed that the lesion was within the breast tissues.  Subsequently, it was sent for Touch Prep.  Touch Prep showed no evidence of carcinoma at the margins.  However, there were some atypical cells on three margins.  Grossly, the closest margin was approximately 1 mm.  This was the posterior margin right  against the chest wall.  Attention was then turned to the left axilla and following mapping of the axilla, the area just inferior to the midaxillary line showed a region of high uptake.  Transverse incision was made in this region carrying the dissection down using the Neoprobe to map our way to the sentinel node.  Upon encountering the node, the node was stained blue with maximum counts approximately 3000.  This node was dissected free along with two other hot blue nodes which were removed and forwarded for pathologic evaluation.  There was a warm blue node which was also removed and forwarded for pathologic evaluation.  Touch Preps on all the nodes were negative for tumor.  The wounds were then thoroughly irrigated with normal saline.  Sponge, instrument and sharp counts were verified.  The breast was closed in two layers using 3-0 Vicryl and 4-0 Monocryl.  Axillary wound closed in two layers using 3-0 Vicryl and 4-0 Monocryl sutures.  All the wounds were then reinforced with Steri-Strips and a sterile compressive dressing applied. Anesthetic reversed.  Patient removed from the operating room to the recovery room in stable condition having tolerated the procedure well. DD:  04/09/00 TD:  04/10/00 Job: [id number]

## 2010-11-17 NOTE — Op Note (Signed)
[place]  Patient:    Megan Rollins, Megan Rollins                       MRN: [id number] Proc. Date: 04/30/00 Adm. Date:  [id number] Attending:  Sonda Primes CC:         Mardene Celeste. Lurene Shadow, M.D.   Operative Report  PREOPERATIVE DIAGNOSIS:  Carcinoma of the left breast.  POSTOPERATIVE DIAGNOSIS:  Carcinoma of the left breast.  OPERATION PERFORMED:  Re-excision following lumpectomy of left breast tumor.  SURGEON:  Mardene Celeste. Lurene Shadow, M.D.  ASSISTANT:  Nurse.  ANESTHESIA:  General.  INDICATIONS FOR PROCEDURE:  The patient is a 55 year old woman with diagnosed breast cancer who underwent a lumpectomy and sentinel lymph node dissection and lymph node mapping.  She had negative sentinel nodes, was noted to have positive margins on the anterior and inferior margins of the specimen.  She returns to the operating room now for re-excision of this area to secure clear margins.  DESCRIPTION OF PROCEDURE:  Following the induction of anesthesia, the patient was positioned supinely.  The left breast was prepped and draped to be included in a sterile operative field.  I made an elliptical incision around the previous scar, deepened this down to the breast tissue taking wide margins primarily inferiorly and anteriorly by raising a flap of breast tissue inferiorly.  This was carried down to be on the previous biopsy site and the tissue was completely excised down to the chest wall on previous biopsy of the chest wall it was the posterior margins.  This was appropriately labeled and forwarded for pathologic evaluation.  Hemostasis was secured with electrocautery.  Subcutaneous tissues then reapproximated with 3-0 Vicryl sutures after sponge, instrument and sharp counts were verified.  The skin was closed with a running 5-0 Monocryl suture.  The wound was then reinforced with Steri-Strips and sterile dressings applied.  Anesthetic reversed.  Patient removed  from the operating room to the recovery room in stable condition having tolerated the procedure well. DD:  04/30/00 TD:  04/30/00 Job: [id number]

## 2010-11-17 NOTE — Op Note (Signed)
NAMETYREANNA, BISESI                ACCOUNT NO.:  [id number]   MEDICAL RECORD NO.:  [id number]          PATIENT TYPE:  AMB   LOCATION:  SDC                           FACILITY:  [facility]   PHYSICIAN:  Lenoard Aden, M.D.DATE OF BIRTH:  [birth year]   DATE OF PROCEDURE:  01/09/2006  DATE OF DISCHARGE:                                 OPERATIVE REPORT   PREOPERATIVE DIAGNOSES:  1.  Dysfunctional uterine bleeding/postmenopausal bleeding, refractory.  2.  Breast cancer, in remission.   POSTOPERATIVE DIAGNOSES:  1.  Dysfunctional uterine bleeding/postmenopausal bleeding, refractory.  2.  Breast cancer, in remission.  3.  Enterocele.   PROCEDURE:  1.  Laparoscopically assisted vaginal hysterectomy.  2.  Bilateral salpingo-oophorectomy.  3.  McCall culdoplasty.   SURGEON:  Lenoard Aden, M.D.   ASSISTANT:  Richardean Sale, M.D.   ANESTHESIA:  General.   ANESTHESIOLOGIST:  Germaine Pomfret, M.D.   ESTIMATED BLOOD LOSS:  100 mL.   COMPLICATIONS:  None.   DRAINS:  Foley.   COUNTS:  Correct.   DISPOSITION:  Patient to Recovery in good condition.   BRIEF OPERATIVE NOTE:  After being apprised of the risks of anesthesia,  infection, bleeding, injury to abdominal organs with need for repair,  delayed versus immediate complications to include bowel and bladder injury  were noted, the patient is brought to the operating room where she is  administered a general anesthetic without complications, prepped and draped  in the usual sterile fashion, Foley catheter in place.  After achieving  adequate anesthesia, dilute Marcaine solution and Hulka tenaculum are placed  per vagina.  Infraumbilical incision is made with a scalpel, Veress needle  placed, opening pressure of -2 noted, 3 L of CO2 insufflated without  difficulty, atraumatic trocar entry placed and noted.  At this time, normal  liver, gallbladder bed, normal subdiaphragmatic area noted, normal  appendiceal area, normal-size  uterus, normal anterior and posterior cul-de-  sac, bilateral normal tubes and ovaries.  Two 5-mm trocar sites are made  bilaterally in the midclavicular to axillary line after transillumination of  the vessels three-quarters of the way up from the supra-symphysis pubis  towards the umbilicus.  These trocars are placed under direct visualization  and traumatically.  The infundibulopelvic ligament is then identified on the  right side.  A Gyrus device is entered and ureter is identified on both  sides.  The infundibulopelvic ligaments are bilaterally ligated using the  tripolar.  The sub-mesosalpingeal area is cauterized down to the level of  the round ligament, which is then divided bilaterally.  Reflection is  developed and anterior and posterior leaves are divided using  electrocautery, noting the ureter during the process on both sides.  Uterine  vessels are bilaterally skeletonized and cauterized directly using the  tripolar device.  Bladder flap is developed sharply using scissors and at  this time, attention is turned to the vaginal portion of the procedure,  whereby a weighted speculum is placed.  The cervicovaginal junction is  infiltrated using a dilute Pitressin solution and scored using  electrocautery; this is then developed sharply.  Anterior and posterior cul-  de-sac entries are made atraumatically.  Speculums are placed, a weighted  speculum placed in the posterior cul-de-sac.  The uterosacral ligaments are  bilaterally grasped and suture-ligated with a Heaney clamps and transfixed  to the vaginal cuff.  LigaSure is used to take progressive bites up to the  cardinal and broad ligament complexes and the specimen is removed  atraumatically.  The enterocele is identified and closed using anterior and  external McCall culdoplasty sutures, which are tied in the standard fashion.  Good plication of the enterocele is noted.  The vaginal cuff is then closed  side-to-side using  interrupted 0 Vicryl sutures.  Irrigation is  accomplished; good hemostasis is noted.  Vaginal packing coated with  Bacitracin cream is placed.  Attention is therefore turned to the abdominal  portion of the procedure, whereby visualization reveals good hemostasis  along the entire surgical area, no evidence of active bleeding.  CO2 is  released and no active bleeding is noted.  Irrigation is accomplished.  At  this time, procedure is terminated.  All trocars are removed under direct  visualization.  Sites are closed due to bleeding with 4-0 Vicryl mattress  sutures in the lower 2 ports, 0 Vicryl and Dermabond in the upper abdominal  port.  The patient tolerates the procedure well and is transferred to  recovery room in good condition.      Lenoard Aden, M.D.  Electronically Signed     RJT/MEDQ  D:  01/09/2006  T:  01/09/2006  Job:  [id number]   cc:   [facility]

## 2010-11-17 NOTE — H&P (Signed)
[facility]  Patient:    Megan Rollins, Megan Rollins                         MRN: [id number] Adm. Date:  01/01/01 Attending:  Lenoard Aden, M.D.                         History and Physical  CHIEF COMPLAINT:                Irregular uterine bleeding with endometrial mass.  HISTORY OF PRESENT ILLNESS:     The patient is a 55 year old white female with a history of new onset breast cancer status post chemotherapy, status post lumpectomy x 2 with negative sentinel nodes who presents with bleeding on tamoxifen and questionable endometrial mass.  ALLERGIES:                      PENICILLIN and SULFA DRUGS.  MEDICATIONS:                    Tamoxifen.  PAST MEDICAL HISTORY:           Her past medical history is remarkable for no other medical or surgical hospitalizations.  GYNECOLOGIC HISTORY:            Unremarkable.  She has had two pregnancies, one uncomplicated C-section and one vaginal delivery.  FAMILY HISTORY:                 She has a family history of breast cancer and heart disease.  She is a nonsmoker and nondrinker.  SOCIAL HISTORY:                 She denies domestic or physical violence.  PHYSICAL EXAMINATION: GENERAL:                        She is a well-developed, well-nourished white female in no apparent distress.  HEENT:                          Normal.  LUNGS:                          Clear.  HEART:                          Regular rhythm.  ABDOMEN:                        Soft, nontender.  PELVIC:                         Exam reveals an anteflexed uterus.  No adnexal mass.  Endometrial mass anterior wall of the uterus as demonstrated on hysterography.  EXTREMITIES:                    No cords.  NEUROLOGICAL:                   Exam is nonfocal.  IMPRESSION:;                    Postmenopausal bleeding with endometrial mass, currently on tamoxifen.  PLAN:  Proceed with diagnostic  hysteroscopy, resectoscope D&C.   The risks of anesthesia, bleeding, infection, uterine perforation, need for repair discussed.  The patient acknowledges and desires to proceed. DD:  12/31/00 TD:  12/31/00 Job: [id number]

## 2011-03-16 ENCOUNTER — Other Ambulatory Visit: Payer: Self-pay | Admitting: Oncology

## 2011-03-16 DIAGNOSIS — Z1231 Encounter for screening mammogram for malignant neoplasm of breast: Secondary | ICD-10-CM

## 2011-04-20 ENCOUNTER — Ambulatory Visit
Admission: RE | Admit: 2011-04-20 | Discharge: 2011-04-20 | Disposition: A | Discharge status: Home / Self Care | Payer: Self-pay | Source: Ambulatory Visit | Attending: Oncology | Admitting: Oncology

## 2011-04-20 DIAGNOSIS — Z1231 Encounter for screening mammogram for malignant neoplasm of breast: Secondary | ICD-10-CM

## 2011-04-25 ENCOUNTER — Other Ambulatory Visit: Payer: Self-pay | Admitting: Oncology

## 2011-04-25 DIAGNOSIS — R928 Other abnormal and inconclusive findings on diagnostic imaging of breast: Secondary | ICD-10-CM

## 2011-05-11 ENCOUNTER — Ambulatory Visit
Admission: RE | Admit: 2011-05-11 | Discharge: 2011-05-11 | Disposition: A | Discharge status: Home / Self Care | Payer: PPO | Source: Ambulatory Visit | Attending: Oncology | Admitting: Oncology

## 2011-05-11 DIAGNOSIS — R928 Other abnormal and inconclusive findings on diagnostic imaging of breast: Secondary | ICD-10-CM

## 2011-11-20 DIAGNOSIS — G2581 Restless legs syndrome: Secondary | ICD-10-CM | POA: Insufficient documentation

## 2012-07-02 HISTORY — PX: TIBIA FRACTURE SURGERY: SHX806

## 2012-07-30 ENCOUNTER — Inpatient Hospital Stay: Payer: Self-pay | Admitting: Specialist

## 2012-07-30 LAB — CBC WITH DIFFERENTIAL/PLATELET
Basophil #: 0 10*3/uL (ref 0.0–0.1)
Basophil %: 0.4 %
Eosinophil %: 0.4 %
HCT: 37.5 % (ref 35.0–47.0)
HGB: 12.6 g/dL (ref 12.0–16.0)
Lymphocyte #: 1.3 10*3/uL (ref 1.0–3.6)
Lymphocyte %: 14.6 %
MCV: 94 fL (ref 80–100)
Monocyte #: 0.5 x10 3/mm (ref 0.2–0.9)
Neutrophil %: 78.7 %
RBC: 3.98 10*6/uL (ref 3.80–5.20)
WBC: 9.2 10*3/uL (ref 3.6–11.0)

## 2012-07-30 LAB — COMPREHENSIVE METABOLIC PANEL
Albumin: 3.7 g/dL (ref 3.4–5.0)
Anion Gap: 9 (ref 7–16)
BUN: 12 mg/dL (ref 7–18)
Bilirubin,Total: 0.2 mg/dL (ref 0.2–1.0)
EGFR (Non-African Amer.): >60
Osmolality: 280 (ref 275–301)
SGOT(AST): 13 U/L — ABNORMAL LOW (ref 15–37)
SGPT (ALT): 16 U/L (ref 12–78)
Sodium: 140 mmol/L (ref 136–145)
Total Protein: 6.5 g/dL (ref 6.4–8.2)

## 2012-07-30 LAB — URINALYSIS, COMPLETE
Blood: NEGATIVE
Ketone: NEGATIVE
Leukocyte Esterase: NEGATIVE
Nitrite: NEGATIVE
Ph: 6 (ref 4.5–8.0)
Protein: NEGATIVE
Specific Gravity: 1.016 (ref 1.003–1.030)
WBC UR: 2 /HPF (ref 0–5)

## 2012-07-30 LAB — HEMOGLOBIN: HGB: 10.6 g/dL — ABNORMAL LOW (ref 12.0–16.0)

## 2012-07-30 LAB — PROTIME-INR
INR: 1
Prothrombin Time: 13.3 secs (ref 11.5–14.7)

## 2012-07-30 LAB — APTT: Activated PTT: 29.7 secs (ref 23.6–35.9)

## 2012-07-31 LAB — HEMATOCRIT: HCT: 29.4 % — ABNORMAL LOW (ref 35.0–47.0)

## 2012-11-06 ENCOUNTER — Other Ambulatory Visit: Payer: Self-pay

## 2012-11-06 DIAGNOSIS — Z9889 Other specified postprocedural states: Secondary | ICD-10-CM

## 2012-11-06 DIAGNOSIS — Z1231 Encounter for screening mammogram for malignant neoplasm of breast: Secondary | ICD-10-CM

## 2012-11-06 DIAGNOSIS — Z853 Personal history of malignant neoplasm of breast: Secondary | ICD-10-CM

## 2012-12-12 ENCOUNTER — Ambulatory Visit: Payer: PPO

## 2013-01-07 ENCOUNTER — Ambulatory Visit: Payer: Self-pay

## 2013-01-14 ENCOUNTER — Other Ambulatory Visit: Payer: Self-pay

## 2013-01-14 ENCOUNTER — Encounter: Payer: Self-pay | Admitting: General Surgery

## 2013-01-14 ENCOUNTER — Ambulatory Visit (INDEPENDENT_AMBULATORY_CARE_PROVIDER_SITE_OTHER): Payer: PRIVATE HEALTH INSURANCE | Admitting: General Surgery

## 2013-01-14 VITALS — BP 110/68 | HR 78 | Resp 14 | Ht 68.0 in | Wt 149.0 lb

## 2013-01-14 DIAGNOSIS — N6009 Solitary cyst of unspecified breast: Secondary | ICD-10-CM | POA: Insufficient documentation

## 2013-01-14 DIAGNOSIS — N6002 Solitary cyst of left breast: Secondary | ICD-10-CM

## 2013-01-14 DIAGNOSIS — R928 Other abnormal and inconclusive findings on diagnostic imaging of breast: Secondary | ICD-10-CM

## 2013-01-14 DIAGNOSIS — N63 Unspecified lump in unspecified breast: Secondary | ICD-10-CM | POA: Insufficient documentation

## 2013-01-14 NOTE — Progress Notes (Signed)
Patient ID: Megan Rollins, female   DOB: 1956/03/20, 57 y.o.   MRN: [id number]  Chief Complaint  Patient presents with  . Other    breast problems    HPI Megan Rollins is a 57 y.o. female  Here for bilateral breast thickening. Patient has a history of left breast cancer in 2001 that was treated with a lumpectomy, followed by radiation and chemo therapy and 5 years of Tamoxifen. A trial of an aromatase inhibitor after 5 years of tamoxifen was unsuccessful. She had her treatment completed in [place].  States she may have a cyst in her right breast. Mammogram completed at [facility] on 01/07/13. Patient states some tenderness in right breast with palpation.  HPI  Past Medical History  Diagnosis Date  . Cancer 2001    left breast    Past Surgical History  Procedure Laterality Date  . Breast surgery Left 2001    lumpectomy with radiation therapy and chemo therapy  . Tibia fracture surgery  2014    rod placed  . Abdominal hysterectomy  2005    total    Family History  Problem Relation Age of Onset  . Liver cancer Father   . Breast cancer Mother     Social History History  Substance Use Topics  . Smoking status: Current Every Day Smoker -- 0.50 packs/day for 20 years    Types: Cigarettes  . Smokeless tobacco: Never Used  . Alcohol Use: Yes    Allergies  Allergen Reactions  . Biaxin (Clarithromycin) Nausea Only  . Penicillins Rash  . Sulfa Antibiotics Rash    Current Outpatient Prescriptions  Medication Sig Dispense Refill  . BIOTIN 5000 PO Take 1 capsule by mouth daily.      . Calcium Carbonate-Vitamin D (CALCIUM + D PO) Take 1 capsule by mouth daily.      . citalopram (CELEXA) 40 MG tablet Take 40 mg by mouth daily.      . clonazePAM (KLONOPIN) 0.5 MG tablet Take 1.5 mg by mouth every evening.      . Melatonin 5 MG CAPS Take 1 capsule by mouth every evening.       No current facility-administered medications for this visit.    Review of Systems Review of Systems   Constitutional: Negative.   Respiratory: Negative.   Cardiovascular: Negative.     Blood pressure 110/68, pulse 78, resp. rate 14, height 5\' 8"  (1.727 m), weight 149 lb (67.586 kg).  Physical Exam Physical Exam  Constitutional: She is oriented to person, place, and time. She appears well-developed and well-nourished.  Cardiovascular: Normal rate, regular rhythm and normal heart sounds.   Pulmonary/Chest: Breath sounds normal. Right breast exhibits no inverted nipple, no mass, no nipple discharge, no skin change and no tenderness. Left breast exhibits no inverted nipple, no mass, no nipple discharge, no skin change and no tenderness.  Right breast nodular at 9 o'clock small skin cyst at 9 o'clock near  Areolar. Left breast well healed scar 12 o'clock and some thickening.  Lymphadenopathy:    She has no cervical adenopathy.    She has no axillary adenopathy.  Neurological: She is alert and oriented to person, place, and time.  Skin: Skin is warm and dry.    Data Reviewed Mammograms of January 07, 2013 were reviewed. There appears to be a slightly enlarging mass in the central aspect of the left breast compared were 2012 study. The right breast is unremarkable.  Ultrasound examination of the left breast showed  a hypoechoic lesion at the 9:00 position, 3 cm of the nipple. This measured 0.43 x 0.55 x 0.68 cm. Some faint acoustic shadowing was identified. The patient was amenable to aspiration. This was completed using 1 cc of 1% plain Xylocaine. Mild discomfort during the procedure. A small amount of fluid was obtained and cytology slides x2 prepared.  In the area of slight palpable thickening in the right breast at the 9:00 position, 8 cm from the nipple a poorly defined hypoechoic nodule with some faint acoustic shadowing was identified measuring 0.27 x 0.34 x 0.39 cm. The patient was again amenable, reluctantly the aspiration. This was again completed using 1 cc of 1% plain Xylocaine it was  better tolerated. Multiple passes through the lesion were completed and slides x2 prepared.  Assessment    Slightly enlarging smoothly marginated left breast nodule, likely benign. Focal nodularity in the right breast.     Plan    The patient will be contacted when cytology reports are available.        Earline Mayotte 01/14/2013, 8:35 PM

## 2013-01-19 LAB — FINE-NEEDLE ASPIRATION

## 2013-01-20 ENCOUNTER — Telehealth: Payer: Self-pay | Admitting: *Deleted

## 2013-01-20 NOTE — Telephone Encounter (Signed)
Notified patient as instructed, patient pleased. Discussed follow-up appointments for 6 month office ultrasound, patient agrees Continue self breast exams. Call office for any new breast issues or concerns.

## 2013-01-20 NOTE — Telephone Encounter (Signed)
Message copied by Currie Paris on Tue Jan 20, 2013  3:48 PM ------      Message from: Earline Mayotte      Created: Tue Jan 20, 2013  3:35 PM       Please notify the patient the cytology reports were fine. No evidence of cancer. Would like her to return in six months for a repeat ultrasound. Thanks.      ----- Message -----         From: [facility] Lab Results In Interface         Sent: 01/19/2013   4:38 PM           To: Earline Mayotte, MD                   ------

## 2013-01-23 ENCOUNTER — Encounter: Payer: Self-pay | Admitting: General Surgery

## 2013-08-04 ENCOUNTER — Other Ambulatory Visit: Payer: PRIVATE HEALTH INSURANCE

## 2013-08-04 ENCOUNTER — Ambulatory Visit (INDEPENDENT_AMBULATORY_CARE_PROVIDER_SITE_OTHER): Payer: PRIVATE HEALTH INSURANCE | Admitting: General Surgery

## 2013-08-04 ENCOUNTER — Encounter: Payer: Self-pay | Admitting: General Surgery

## 2013-08-04 VITALS — BP 100/62 | HR 76 | Resp 12 | Ht 68.0 in | Wt 157.0 lb

## 2013-08-04 DIAGNOSIS — N63 Unspecified lump in unspecified breast: Secondary | ICD-10-CM

## 2013-08-04 NOTE — Progress Notes (Signed)
Patient ID: Megan Rollins, female   DOB: [birth year], 58 y.o.   MRN: [id number]  Chief Complaint  Patient presents with  . Follow-up    right breast ultrasound    HPI Megan Rollins is a 58 y.o. female here today for an right breast ultrasound. Patient states she is still having pain and tenderness in her right breast, this does not appear to be related to activity, more direct pressure. Patient does perform self breast checks and get regular mammograms.  HPI  Past Medical History  Diagnosis Date  . Cancer 2001    left breast    Past Surgical History  Procedure Laterality Date  . Breast surgery Left 2001    lumpectomy with radiation therapy and chemo therapy  . Tibia fracture surgery  2014    rod placed  . Abdominal hysterectomy  2005    total  . Oophorectomy Bilateral 2005    Family History  Problem Relation Age of Onset  . Liver cancer Father   . Breast cancer Mother     Social History History  Substance Use Topics  . Smoking status: Current Every Day Smoker -- 0.50 packs/day for 20 years    Types: Cigarettes  . Smokeless tobacco: Never Used  . Alcohol Use: Yes    Allergies  Allergen Reactions  . Biaxin [Clarithromycin] Nausea Only  . Penicillins Rash  . Sulfa Antibiotics Rash    Current Outpatient Prescriptions  Medication Sig Dispense Refill  . BIOTIN 5000 PO Take 1 capsule by mouth daily.      . Calcium Carbonate-Vitamin D (CALCIUM + D PO) Take 1 capsule by mouth daily.      . citalopram (CELEXA) 40 MG tablet Take 40 mg by mouth daily.      . clonazePAM (KLONOPIN) 0.5 MG tablet Take 1.5 mg by mouth every evening.      . Melatonin 5 MG CAPS Take 1 capsule by mouth every evening.       No current facility-administered medications for this visit.    Review of Systems Review of Systems  Constitutional: Negative.   Respiratory: Negative.   Cardiovascular: Negative.     Blood pressure 100/62, pulse 76, resp. rate 12, height 5\' 8"  (1.727 m), weight  157 lb (71.215 kg).  Physical Exam Physical Exam  Constitutional: She is oriented to person, place, and time. She appears well-developed and well-nourished.  Eyes: Conjunctivae are normal.  Neck: Neck supple.  Cardiovascular: Normal rate, regular rhythm and normal heart sounds.   Pulmonary/Chest: Breath sounds normal. Right breast exhibits no inverted nipple, no mass, no nipple discharge, no skin change and no tenderness. Left breast exhibits no inverted nipple, no mass, no nipple discharge, no skin change and no tenderness.  Left breast well healed incision from 12-2 . Thickening in the upper outer quadrant below the incision. Tender in the right breast between 11-12.   Lymphadenopathy:    She has no cervical adenopathy.    She has no axillary adenopathy.  Neurological: She is alert and oriented to person, place, and time.  Skin: Skin is warm and dry.    Data Reviewed Ultrasound examination of the left breast in the 9:00 position 3 cm from the nipple showed a 0.44 x 0.63 x 0.71 smoothly marginated hypoechoic nodule with acoustic enhancement and no vascular flow. This shows less than 1 mm change since her July 2014 exam.  At the 12:00 position approximately 1-1/2 cm below her previous wide excision site and an area  of focal thickening the breast parenchyma is noted to extend more closely to the underlying dermis but is otherwise unremarkable.  In the retroareolar area of the right breast multiple mildly dilated ducts up to 2.7 mm right side without any intraductal lesions appreciated. This is primarily in the area from the base of the nipple to the edge of the areola circumferentially.  At the 9:00 position where previously appreciated 4 mm area was identified and aspirated in July, 2014 no residual density is appreciated.    July 2014 FNA results  DIAGNOSIS:  Comment   Comments: RIGHT BREAST 9:00 NEGATIVE FOR MALIGNANT CELLS. HYPOCELLULAR SPECIMEN CONSISTS OF FEW DUCTAL  CELLS. CYTOLOGIC FINDINGS SHOULD BE EVALUATED IN CONJUNCTION WITH PHYSICAL AND MAMMOGRAPHIC FINDINGS.  DIAGNOSIS:  Comment   Comments: LEFT BREAST 9:00 NEGATIVE FOR MALIGNANT CELLS. DUCTAL CELLS, AND FEW STROMAL ELEMENTS ARE PRESENT. PROTEINACEOUS MATERIAL IS PRESENT. CYTOLOGIC FINDINGS SHOULD BE EVALUATED IN CONJUNCTION WITH PHYSICAL AND MAMMOGRAPHIC FINDINGS.  Assessment    Benign breast exam.    Plan    The patient should resume annual screening mammograms through the [facility] office.       Robert Bellow 08/05/2013, 7:20 AM

## 2013-08-04 NOTE — Patient Instructions (Addendum)
Patient to return as needed. 

## 2013-09-29 ENCOUNTER — Ambulatory Visit: Payer: PPO

## 2013-10-06 ENCOUNTER — Ambulatory Visit: Payer: PPO

## 2013-10-13 ENCOUNTER — Ambulatory Visit
Admission: RE | Admit: 2013-10-13 | Discharge: 2013-10-13 | Disposition: A | Discharge status: Home / Self Care | Payer: 59 | Source: Ambulatory Visit

## 2013-10-13 DIAGNOSIS — Z1231 Encounter for screening mammogram for malignant neoplasm of breast: Secondary | ICD-10-CM

## 2013-10-13 DIAGNOSIS — Z853 Personal history of malignant neoplasm of breast: Secondary | ICD-10-CM

## 2013-10-13 DIAGNOSIS — Z9889 Other specified postprocedural states: Secondary | ICD-10-CM

## 2014-04-19 ENCOUNTER — Encounter: Payer: Self-pay | Admitting: Diagnostic Neuroimaging

## 2014-04-19 ENCOUNTER — Ambulatory Visit (INDEPENDENT_AMBULATORY_CARE_PROVIDER_SITE_OTHER): Payer: 59 | Admitting: Diagnostic Neuroimaging

## 2014-04-19 ENCOUNTER — Encounter (INDEPENDENT_AMBULATORY_CARE_PROVIDER_SITE_OTHER): Payer: Self-pay

## 2014-04-19 VITALS — Temp 97.3°F | Ht 67.0 in | Wt 165.2 lb

## 2014-04-19 DIAGNOSIS — G43109 Migraine with aura, not intractable, without status migrainosus: Secondary | ICD-10-CM

## 2014-04-19 DIAGNOSIS — G450 Vertebro-basilar artery syndrome: Secondary | ICD-10-CM

## 2014-04-19 DIAGNOSIS — R413 Other amnesia: Secondary | ICD-10-CM

## 2014-04-19 DIAGNOSIS — R55 Syncope and collapse: Secondary | ICD-10-CM

## 2014-04-19 NOTE — Patient Instructions (Signed)
I will check MRI, MRA scans.  Then we may consider migraine medications.

## 2014-04-19 NOTE — Progress Notes (Addendum)
[facility]  PATIENT: Megan Rollins DOB: October 06, 1955  REFERRING CLINICIAN: Wolfgang Phoenix HISTORY FROM: patient and husband REASON FOR VISIT: new consult   HISTORICAL  CHIEF COMPLAINT:  Chief Complaint  Patient presents with  . Migraine  . Loss of Consciousness  . Dizziness  . Eye Problem    blurred    HISTORY OF PRESENT ILLNESS:   58 year old right-handed female history of breast cancer, here for evaluation of headaches, presyncopal events, memory loss, confusion. June 2015 patient was at work walking down the hallway, when she began to see "sparkling lights" in her peripheral vision. Then she collapsed to the ground. She doesn't think she fully lost consciousness. Patient works at a medical office and she had blood pressure, vital signs, EKG, CT of the head, evaluation immediately for unremarkable. Following this she developed frontal throbbing headache with photophobia, nausea. Patient continues to have intermittent headaches once per week. She is taking Excedrin Migraine for management. Paragraph 2 weeks the patient had another episode where she had 15 minute episode of lightheadedness, dizziness, difficulty speaking, blurred vision, transient visual loss, then followed by headache.  Today while in our waiting room lobby, patient had a brief, similar event. While examining patient, patient described increasing throbbing headache. Episode in the lobby occurred while she was sitting down. Paragraph no recent infections, stresses, trauma, change in medication.    REVIEW OF SYSTEMS: Full 14 system review of systems performed and notable only for fatigue spinning sensation blurred vision easy bruising allergy memory loss headache confusion slurred speech dizziness passing out sleepiness snoring restless legs too much sleep decreased energy.  ALLERGIES: Allergies  Allergen Reactions  . Biaxin [Clarithromycin] Nausea Only  . Penicillins Rash  . Sulfa Antibiotics Rash      HOME MEDICATIONS: Outpatient Prescriptions Prior to Visit  Medication Sig Dispense Refill  . BIOTIN 5000 PO Take 1 capsule by mouth daily.      . Calcium Carbonate-Vitamin D (CALCIUM + D PO) Take 1 capsule by mouth daily.      . citalopram (CELEXA) 40 MG tablet Take 40 mg by mouth daily.      . clonazePAM (KLONOPIN) 0.5 MG tablet Take 1.5 mg by mouth every evening.      . Melatonin 5 MG CAPS Take 1 capsule by mouth every evening.       No facility-administered medications prior to visit.    PAST MEDICAL HISTORY: Past Medical History  Diagnosis Date  . Cancer 2001    left breast  . Cancer     PAST SURGICAL HISTORY: Past Surgical History  Procedure Laterality Date  . Breast surgery Left 2001    lumpectomy with radiation therapy and chemo therapy  . Tibia fracture surgery  2014    rod placed  . Abdominal hysterectomy  2005    total  . Oophorectomy Bilateral 2005  . Cesarean section  1984  . Shoulder surgery  2008    FAMILY HISTORY: Family History  Problem Relation Age of Onset  . Liver cancer Father   . Breast cancer Mother   . Narcolepsy Brother     SOCIAL HISTORY:  History   Social History  . Marital Status: Married    Spouse Name: Bill    Number of Children: 2  . Years of Education: College   Occupational History  .     Social History Main Topics  . Smoking status: Current Every Day Smoker -- 0.50 packs/day for 20 years  Types: Cigarettes  . Smokeless tobacco: Never Used  . Alcohol Use: Yes  . Drug Use: No  . Sexual Activity: Not on file   Other Topics Concern  . Not on file   Social History Narrative  . No narrative on file     PHYSICAL EXAM  Filed Vitals:   04/19/14 1048  Temp: 97.3 F (36.3 C)  TempSrc: Oral  Height: 5\' 7"  (1.702 m)  Weight: 165 lb 3.2 oz (74.934 kg)    Not recorded    Visual Acuity Screening   Right eye Left eye Both eyes  Without correction: unable    With correction: 20/70       Body mass index is  25.87 kg/(m^2).  GENERAL EXAM: Patient is in no distress; well developed, nourished and groomed; neck is supple  CARDIOVASCULAR: Regular rate and rhythm, no murmurs, no carotid bruits  NEUROLOGIC: MENTAL STATUS: awake, alert, oriented to person, place and time, recent and remote memory intact, normal attention and concentration, language fluent, comprehension intact, naming intact, fund of knowledge appropriate CRANIAL NERVE: no papilledema on fundoscopic exam, pupils equal and reactive to light, visual fields full to confrontation, extraocular muscles intact, no nystagmus, facial sensation and strength symmetric, hearing intact, palate elevates symmetrically, uvula midline, shoulder shrug symmetric, tongue midline. MOTOR: normal bulk and tone, full strength in the BUE, BLE SENSORY: normal and symmetric to light touch, pinprick, temperature, vibration  COORDINATION: finger-nose-finger, fine finger movements normal REFLEXES: deep tendon reflexes present and symmetric GAIT/STATION: narrow based gait; able to walk on toes, heels and tandem; romberg is negative    DIAGNOSTIC DATA (LABS, IMAGING, TESTING) - I reviewed patient records, labs, notes, testing and imaging myself where available.  Lab Results  Component Value Date   WBC 4.8 11/23/2008   HGB 11.8* 11/23/2008   HCT 33.7* 11/23/2008   MCV 92.6 11/23/2008   PLT 113* 11/23/2008   No results found for this basename: na, k, cl, co2, glucose, bun, creatinine, calcium, prot, albumin, ast, alt, alkphos, bilitot, gfrnonaa, gfraa   No results found for this basename: CHOL, HDL, LDLCALC, LDLDIRECT, TRIG, CHOLHDL   No results found for this basename: HGBA1C   No results found for this basename: VITAMINB12   No results found for this basename: TSH    I reviewed images myself and have included my interpretation below. -VRP  12/21/13 CT head - right basal ganglia and left frontal periventricular hypodense foci; possible chronic small vessel  ischemic disease.    ASSESSMENT AND PLAN  58 y.o. year old female here with several episodes of scintillating scotoma, transient visual loss, lightheadedness, presyncope, followed by throbbing headache with photophobia and nausea. Most likely represents migraine phenomenon. However patient has never had migraine earlier in her life. No triggering factors. No similar events. Therefore must consider vertebrobasilar TIA or other causes of presyncope (cardiogenic, neurogenic).  Ddx: vertebrobasilar TIA, syncope (cardiogenic, neurogenic), metabolic, migraine variant  PLAN: - MRI, MRA scans - ask PCP to consider cardiac workup (such as long term cardiac monitor) - may consider migraine treatment after workup complete  Orders Placed This Encounter  Procedures  . MR Brain W Wo Contrast  . MR MRA HEAD WO CONTRAST  . MR Angiogram Neck W Wo Contrast   Return in about 1 month (around 05/20/2014).  I reviewed images, labs, notes, records myself. I summarized findings and reviewed with patient, for this high risk condition (vertebrobasilar TIA vs syncope vs migraine) requiring high complexity decision making.   Chandell Attridge R.  Wardell Pokorski, MD 82/50/0370, 48:88 PM Certified in Neurology, Neurophysiology and Neuroimaging  [facility] [address], [place], [place] [address] [phone]

## 2014-04-22 ENCOUNTER — Ambulatory Visit (INDEPENDENT_AMBULATORY_CARE_PROVIDER_SITE_OTHER): Payer: 59

## 2014-04-22 DIAGNOSIS — R413 Other amnesia: Secondary | ICD-10-CM

## 2014-04-22 DIAGNOSIS — R55 Syncope and collapse: Secondary | ICD-10-CM

## 2014-04-22 DIAGNOSIS — G43109 Migraine with aura, not intractable, without status migrainosus: Secondary | ICD-10-CM

## 2014-04-22 DIAGNOSIS — G450 Vertebro-basilar artery syndrome: Secondary | ICD-10-CM

## 2014-04-22 MED ORDER — GADOPENTETATE DIMEGLUMINE 469.01 MG/ML IV SOLN
20.0000 mL | Freq: Once | INTRAVENOUS | Status: AC | PRN
Start: 2014-04-22 — End: 2014-04-22

## 2014-04-28 ENCOUNTER — Telehealth: Payer: Self-pay | Admitting: Diagnostic Neuroimaging

## 2014-04-28 NOTE — Telephone Encounter (Signed)
Patient is calling to get MRI results. °

## 2014-04-30 NOTE — Telephone Encounter (Signed)
I called pt with MRI results. No major findings. Will follow up in clinic. -VRP

## 2014-04-30 NOTE — Telephone Encounter (Signed)
Please see phone note  

## 2014-05-03 ENCOUNTER — Encounter: Payer: Self-pay | Admitting: Diagnostic Neuroimaging

## 2014-05-20 ENCOUNTER — Telehealth: Payer: Self-pay | Admitting: Diagnostic Neuroimaging

## 2014-05-20 ENCOUNTER — Encounter: Payer: 59 | Admitting: Diagnostic Neuroimaging

## 2014-05-20 ENCOUNTER — Encounter: Payer: Self-pay | Admitting: Diagnostic Neuroimaging

## 2014-05-20 NOTE — Telephone Encounter (Signed)
Patient called back to make sure that she did not get charged for her visit today since she waited back in the exam room for a long time and no one came to tell her that Dr. Leta Baptist was running behind, patient was very upset and wants to make sure that her insurance is not billed for this visit.

## 2014-05-21 NOTE — Telephone Encounter (Signed)
Noted  

## 2014-05-21 NOTE — Telephone Encounter (Signed)
Gae Bon, If the patient was not seen on 05/20/2014 she will not be charged. Thanks Angie

## 2014-05-31 NOTE — Progress Notes (Signed)
This encounter was created in error - please disregard.

## 2014-10-22 NOTE — H&P (Signed)
Subjective/Chief Complaint Pain right leg    History of Present Illness 31 /o female fell on ice last night walking her dog and injured the right leg. Outside for 45 min.   Brought to Emergency Room where exam and X-rays show a spiral displaced fracture of the right tibia/fibula. complains of pain right hip but X-rays and ct scan negative.  Admitted for right tibial rodding.  Discussed treatment options with patient and friend and she wishes to proceed with surgery.  Risks and benefits of surgery were discussed at length including but not limited to infection, non union, nerve or blood vessed damage, non union, need for repeat surgery, blood clots and lung emboli, and death. Plan on surgery today.    Past Medical Health Depression   Past Med/Surgical Hx:  Breast Cancer:   L breast lampectomy:   ALLERGIES:  PCN: Rash, Swelling  Dilaudid: Itching, Rash  Sulfa drugs: Other  HOME MEDICATIONS: Medication Instructions Status  melatonin  orally  Active  Vitamin D3  orally  Active  biotin  orally once a day Active  Celexa 40 milligram(s) orally once a day (at bedtime) Active  Klonopin  orally once a day (at bedtime) Active   Family and Social History:   Family History Non-Contributory    Social History positive  tobacco, positive ETOH    Place of Living Home   Review of Systems:   Fever/Chills No    Cough No    Sputum No    Abdominal Pain No   Physical Exam:   GEN well developed, well nourished, no acute distress    HEENT pink conjunctivae    NECK supple    RESP normal resp effort    CARD regular rate    ABD denies tenderness    GU foley catheter in place    LYMPH negative neck    EXTR negative edema, Tender right lower leg.  circulation/sensation/motor function good. Skin intact.  Pain with hip motion.    SKIN normal to palpation    NEURO motor/sensory function intact    PSYCH alert, A+O to time, place, person, good insight   Lab Results: Hepatic:   29-Jan-14 00:04    Bilirubin, Total 0.2   Alkaline Phosphatase 94   SGPT (ALT) 16   SGOT (AST)  13   Total Protein, Serum 6.5   Albumin, Serum 3.7  Routine BB:  29-Jan-14 01:41    ABO Group + Rh Type A Positive   Antibody Screen NEGATIVE (Result(s) reported on 30 Jul 2012 at 03:44AM.)  Routine Chem:  29-Jan-14 00:04    Glucose, Serum  121   BUN 12   Creatinine (comp)  0.59   Sodium, Serum 140   Potassium, Serum 3.9   Chloride, Serum 107   CO2, Serum 24   Calcium (Total), Serum 8.7   Osmolality (calc) 280   eGFR (African American) >60   eGFR (Non-African American) >60 (eGFR values <74mL/min/1.73 m2 may be an indication of chronic kidney disease (CKD). Calculated eGFR is useful in patients with stable renal function. The eGFR calculation will not be reliable in acutely ill patients when serum creatinine is changing rapidly. It is not useful in  patients on dialysis. The eGFR calculation may not be applicable to patients at the low and high extremes of body sizes, pregnant women, and vegetarians.)   Anion Gap 9  Routine UA:  29-Jan-14 02:34    Color (UA) Yellow   Clarity (UA) Clear   Glucose (UA)  Negative   Bilirubin (UA) Negative   Ketones (UA) Negative   Specific Gravity (UA) 1.016   Blood (UA) Negative   pH (UA) 6.0   Protein (UA) Negative   Nitrite (UA) Negative   Leukocyte Esterase (UA) Negative (Result(s) reported on 30 Jul 2012 at 03:07AM.)   RBC (UA) <1 /HPF   WBC (UA) 2 /HPF   Bacteria (UA) TRACE   Epithelial Cells (UA) 1 /HPF (Result(s) reported on 30 Jul 2012 at 03:07AM.)  Routine Coag:  29-Jan-14 00:04    Activated PTT (APTT) 29.7 (A HCT value >55% may artifactually increase the APTT. In one study, the increase was an average of 19%. Reference: "Effect on Routine and Special Coagulation Testing Values of Citrate Anticoagulant Adjustment in Patients with High HCT Values." American Journal of Clinical Pathology 2006;126:400-405.)   Prothrombin 13.3    INR 1.0 (INR reference interval applies to patients on anticoagulant therapy. A single INR therapeutic range for coumarins is not optimal for all indications; however, the suggested range for most indications is 2.0 - 3.0. Exceptions to the INR Reference Range may include: Prosthetic heart valves, acute myocardial infarction, prevention of myocardial infarction, and combinations of aspirin and anticoagulant. The need for a higher or lower target INR must be assessed individually. Reference: The Pharmacology and Management of the Vitamin K  antagonists: the seventh ACCP Conference on Antithrombotic and Thrombolytic Therapy. DGUYQ.0347 Sept:126 (3suppl): [id number]. A HCT value >55% may artifactually increase the PT.  In one study,  the increase was an average of 25%. Reference:  "Effect on Routine and Special Coagulation Testing Values of Citrate Anticoagulant Adjustment in Patients with High HCT Values." American Journal of Clinical Pathology 2006;126:400-405.)  Routine Hem:  29-Jan-14 00:04    WBC (CBC) 9.2   RBC (CBC) 3.98   Hemoglobin (CBC) 12.6   Hematocrit (CBC) 37.5   Platelet Count (CBC)  145   MCV 94   MCH 31.7   MCHC 33.7   RDW 12.8   Neutrophil % 78.7   Lymphocyte % 14.6   Monocyte % 5.9   Eosinophil % 0.4   Basophil % 0.4   Neutrophil #  7.2   Lymphocyte # 1.3   Monocyte # 0.5   Eosinophil # 0.0   Basophil # 0.0 (Result(s) reported on 30 Jul 2012 at 12:38AM.)     Assessment/Admission Diagnosis /displaced unstable right tibia fracture strain right hip    Plan right tibial rodding   Electronic Signatures: Park Breed (MD)  (Signed 29-Jan-14 12:56)  Authored: CHIEF COMPLAINT and HISTORY, PAST MEDICAL/SURGIAL HISTORY, ALLERGIES, HOME MEDICATIONS, FAMILY AND SOCIAL HISTORY, REVIEW OF SYSTEMS, PHYSICAL EXAM, LABS, ASSESSMENT AND PLAN   Last Updated: 29-Jan-14 12:56 by Park Breed (MD)

## 2014-10-22 NOTE — Discharge Summary (Signed)
PATIENT NAME:  Megan Rollins, Megan Rollins MR#:  [id number] DATE OF BIRTH:  Feb 28, 1956  DATE OF ADMISSION:  07/30/2012  DATE OF DISCHARGE:  08/02/2012  FINAL DIAGNOSES:  Comminuted displaced fracture, right tibia and fibula.   OPERATIONS: On 07/30/2012, open reduction and internal fixation, right tibia with intramedullary tibial VersaNail.   COMPLICATIONS:  None.    CONSULTATIONS:  None.   DISCHARGE MEDICATIONS:  Home medications prior to admission, Norco 5 p.r.n. pain, enteric-coated aspirin 1 p.o. b.i.d.   HISTORY OF PRESENT ILLNESS:  The patient is a 59 year old female, who fell on ice on the night of admission while walking her dog. She laid outside in the snow for 45 minutes until she was found. She was brought to the Emergency Room where exam and x-rays showed a spiral displaced fracture of the right tibia/fibula. She was admitted for operative fixation.   PAST MEDICAL HISTORY AND ILLNESSES: Depression, history of breast cancer.   ALLERGIES:  PENICILLIN, DILAUDID AND SULFA DRUGS.   HOME MEDICATIONS:  Melatonin, vitamin E  biotin, Celexa, Klonopin, clonazepam.    PAST SURGICAL HISTORY:  Left breast lumpectomy.    REVIEW OF SYSTEMS:  Unremarkable.  FAMILY HISTORY:  Unremarkable.   SOCIAL HISTORY:  The patient lives alone and does not smoke or drink. She works part-time for [employer]:  GENERAL:  The patient was alert and cooperative.  EXTREMITIES:  Her right leg was tender and swollen in the tibial shaft region, distally. Neurovascular status was good. The skin was intact.   LABORATORY DATA:  On admission was satisfactory.   HOSPITAL COURSE: On 07/30/2012, the patient underwent open reduction and internal fixation of the right tibia with intramedullary VersaNail rod. She did well postop.  The patient was ambulated. She was discharged on 08/02/2012.  She is to ice and elevate the leg, and will be seen in my office in 10 days to 2 weeks for  exam.   ____________________________ Park Breed, MD hem:dm D: 08/19/2012 13:15:00 ET T: 08/19/2012 13:32:45 ET JOB#: [id number]  cc: Park Breed, MD, <Dictator> Park Breed MD ELECTRONICALLY SIGNED 08/20/2012 8:34

## 2014-10-22 NOTE — Op Note (Signed)
PATIENT NAME:  Megan Rollins, Megan Rollins MR#:  [id number] DATE OF BIRTH:  [birth year]  DATE OF PROCEDURE:  07/30/2012  PREOPERATIVE DIAGNOSIS: Unstable displaced right tibial/fibular shaft fractures.   POSTOPERATIVE DIAGNOSIS: Unstable displaced right tibial/fibular shaft fractures.   PROCEDURE: Open reduction and internal fixation of right tibia with Biomet intramedullary tibial VersaNail (9 mm x 34 cm), 3 locking screws.   SURGEON: Park Breed, M.D.   ANESTHESIA: Spinal.   COMPLICATIONS: None.   DRAINS: None.   ESTIMATED BLOOD LOSS: 75 mL.  REPLACEMENT: None.   DESCRIPTION OF PROCEDURE: The patient was brought to the Operating Room where she underwent satisfactory spinal anesthesia, was placed in the supine position on the Operating Room table. The right leg was prepped and draped in sterile fashion. The leg was placed on the tibial distraction device. The foot and ankle were fixed to the traction bow and traction applied with a Coban holding the ankle down. Fluoroscopy showed the fracture to be well aligned in AP and lateral view with excellent length. A proximal incision was made over the anteromedial aspect of the right knee, dissection carried out sharply through subcutaneous tissue with electrocautery being used for hemostasis. A guidepin was inserted on the anterior crest of the tibia, was seen to be in excellent position and alignment. Large reamer was used to increase the opening in the proximal tibia. A ball-tipped guide was then passed down the shaft of the tibia without difficulty all the way down to the epiphyseal plate. This measured just shy of 34.5 cm. Sequential reaming was carried out to 10.5 mm. There was excellent purchase at this point. A 9 mm x 34.5 cm VersaNail was then inserted and passed easily down the tibia across the fracture site and seated fully. Traction was released. Locking screw was placed transversely on the proximal portion of the nail. Under fluoroscopic guidance,  a transverse and an anterior-to-posterior locking screw were placed distally below the fracture. Fluoroscopy showed hardware and fractures to be in excellent alignment and position. The wounds were irrigated. The capsule was closed with 2-0 Vicryl and subcutaneous tissue was closed with 3-0 Vicryl. Skin wounds were closed with staples. Marcaine 0.5% with epinephrine was placed in all wounds. Dry sterile dressings and a posterior splint were applied. A tourniquet was not used. The patient was transferred to her hospital bed and taken to recovery in good condition.    ____________________________ Park Breed, MD hem:jm D: 07/30/2012 19:29:17 ET T: 07/30/2012 20:56:34 ET JOB#: [id number]  cc: Park Breed, MD, <Dictator> Park Breed MD ELECTRONICALLY SIGNED 07/31/2012 13:28

## 2014-12-20 ENCOUNTER — Other Ambulatory Visit: Payer: Self-pay

## 2014-12-20 DIAGNOSIS — Z1231 Encounter for screening mammogram for malignant neoplasm of breast: Secondary | ICD-10-CM

## 2014-12-24 ENCOUNTER — Ambulatory Visit
Admission: RE | Admit: 2014-12-24 | Discharge: 2014-12-24 | Disposition: A | Discharge status: Home / Self Care | Payer: 59 | Source: Ambulatory Visit

## 2014-12-24 DIAGNOSIS — Z1231 Encounter for screening mammogram for malignant neoplasm of breast: Secondary | ICD-10-CM

## 2015-12-26 ENCOUNTER — Emergency Department (HOSPITAL_COMMUNITY): Payer: BLUE CROSS/BLUE SHIELD

## 2015-12-26 ENCOUNTER — Emergency Department (HOSPITAL_COMMUNITY)
Admission: EM | Admit: 2015-12-26 | Discharge: 2015-12-26 | Disposition: A | Discharge status: Home / Self Care | Payer: BLUE CROSS/BLUE SHIELD | Attending: Emergency Medicine | Admitting: Emergency Medicine

## 2015-12-26 ENCOUNTER — Encounter (HOSPITAL_COMMUNITY): Payer: Self-pay | Admitting: Emergency Medicine

## 2015-12-26 DIAGNOSIS — Z79899 Other long term (current) drug therapy: Secondary | ICD-10-CM | POA: Insufficient documentation

## 2015-12-26 DIAGNOSIS — R51 Headache: Secondary | ICD-10-CM | POA: Insufficient documentation

## 2015-12-26 DIAGNOSIS — Z853 Personal history of malignant neoplasm of breast: Secondary | ICD-10-CM | POA: Insufficient documentation

## 2015-12-26 DIAGNOSIS — R519 Headache, unspecified: Secondary | ICD-10-CM

## 2015-12-26 DIAGNOSIS — H539 Unspecified visual disturbance: Secondary | ICD-10-CM

## 2015-12-26 DIAGNOSIS — H538 Other visual disturbances: Secondary | ICD-10-CM | POA: Insufficient documentation

## 2015-12-26 DIAGNOSIS — F17210 Nicotine dependence, cigarettes, uncomplicated: Secondary | ICD-10-CM | POA: Insufficient documentation

## 2015-12-26 LAB — URINALYSIS, ROUTINE W REFLEX MICROSCOPIC
Bilirubin Urine: NEGATIVE
Glucose, UA: NEGATIVE mg/dL
Hgb urine dipstick: NEGATIVE
Ketones, ur: NEGATIVE mg/dL
LEUKOCYTES UA: NEGATIVE
NITRITE: NEGATIVE
PH: 7 (ref 5.0–8.0)
Protein, ur: NEGATIVE mg/dL
Specific Gravity, Urine: 1.008 (ref 1.005–1.030)

## 2015-12-26 LAB — CBC
HEMATOCRIT: 40.9 % (ref 36.0–46.0)
Hemoglobin: 13.9 g/dL (ref 12.0–15.0)
MCH: 31.6 pg (ref 26.0–34.0)
MCHC: 34 g/dL (ref 30.0–36.0)
MCV: 93 fL (ref 78.0–100.0)
Platelets: 184 10*3/uL (ref 150–400)
RBC: 4.4 MIL/uL (ref 3.87–5.11)
RDW: 12.8 % (ref 11.5–15.5)
WBC: 5.5 10*3/uL (ref 4.0–10.5)

## 2015-12-26 LAB — COMPREHENSIVE METABOLIC PANEL
ALK PHOS: 76 U/L (ref 38–126)
ALT: 23 U/L (ref 14–54)
AST: 22 U/L (ref 15–41)
Albumin: 4 g/dL (ref 3.5–5.0)
Anion gap: 9 (ref 5–15)
BILIRUBIN TOTAL: 0.6 mg/dL (ref 0.3–1.2)
BUN: 7 mg/dL (ref 6–20)
CALCIUM: 10 mg/dL (ref 8.9–10.3)
CO2: 27 mmol/L (ref 22–32)
Chloride: 103 mmol/L (ref 101–111)
Creatinine, Ser: 0.84 mg/dL (ref 0.44–1.00)
GFR calc Af Amer: >60 mL/min (ref >60)
GFR calc non Af Amer: >60 mL/min (ref >60)
Glucose, Bld: 96 mg/dL (ref 65–99)
POTASSIUM: 4.3 mmol/L (ref 3.5–5.1)
SODIUM: 139 mmol/L (ref 135–145)
TOTAL PROTEIN: 6.7 g/dL (ref 6.5–8.1)

## 2015-12-26 MED ORDER — SODIUM CHLORIDE 0.9 % IV BOLUS (SEPSIS)
500.0000 mL | Freq: Once | INTRAVENOUS | Status: AC
  Administered 2015-12-26: 500 mL via INTRAVENOUS

## 2015-12-26 MED ORDER — FENTANYL CITRATE (PF) 100 MCG/2ML IJ SOLN
50.0000 ug | INTRAMUSCULAR | Status: DC | PRN
  Administered 2015-12-26 (×2): 50 ug via INTRAVENOUS
  Filled 2015-12-26 (×2): qty 2

## 2015-12-26 MED ORDER — DIPHENHYDRAMINE HCL 50 MG/ML IJ SOLN
25.0000 mg | Freq: Once | INTRAMUSCULAR | Status: AC
  Administered 2015-12-26: 25 mg via INTRAVENOUS
  Filled 2015-12-26: qty 1

## 2015-12-26 MED ORDER — ACETAMINOPHEN 500 MG PO TABS
1000.0000 mg | ORAL_TABLET | Freq: Once | ORAL | Status: AC
  Administered 2015-12-26: 1000 mg via ORAL
  Filled 2015-12-26: qty 2

## 2015-12-26 MED ORDER — METOCLOPRAMIDE HCL 5 MG/ML IJ SOLN
10.0000 mg | Freq: Once | INTRAMUSCULAR | Status: AC
  Administered 2015-12-26: 10 mg via INTRAVENOUS
  Filled 2015-12-26: qty 2

## 2015-12-26 MED ORDER — METOCLOPRAMIDE HCL 10 MG PO TABS: 10.0000 mg | ORAL_TABLET | Freq: Three times a day (TID) | ORAL | Status: DC | PRN

## 2015-12-26 NOTE — Discharge Instructions (Signed)
If you were given medicines take as directed.  If you are on coumadin or contraceptives realize their levels and effectiveness is altered by many different medicines.  If you have any reaction (rash, tongues swelling, other) to the medicines stop taking and see a physician.    If your blood pressure was elevated in the ER make sure you follow up for management with a primary doctor or return for chest pain, shortness of breath or stroke symptoms.  Please follow up as directed and return to the ER or see a physician for new or worsening symptoms.  Thank you. Filed Vitals:   12/26/15 0915 12/26/15 1015 12/26/15 1030 12/26/15 1130  BP: 111/75 130/81 110/69 104/69  Pulse: 61 58 60 62  Temp:      TempSrc:      Resp:      Height:      Weight:      SpO2: 96% 99% 98% 100%

## 2015-12-26 NOTE — ED Notes (Signed)
Pt. Stated, I've had peripheral vision changes for several months.  I just went to my eye doctor and i was fine.  When I have the vision change I have a severe headache, on the left side.I have an appt. July 3 in [place]

## 2015-12-26 NOTE — ED Provider Notes (Signed)
CSN: [id number]     Arrival date & time 12/26/15  0831 History   First MD Initiated Contact with Patient 12/26/15 0930     Chief Complaint  Patient presents with  . Visual Field Change  . Headache    " I have a sore place on my head     (Consider location/radiation/quality/duration/timing/severity/associated sxs/prior Treatment) HPI Comments: 60 year old female with no significant medical history, cigarettes smoker presents with intermittent peripheral visual field changes for the past 2 months and pressure-like headache in the center. Initially left-sided lateral visual field now bilateral peripheral lasting proximal to 10 minutes of time with headache. Patient has no history of ocular or stroke problems. No other neurologic symptoms. Patient had migraines associated with the job/stress but not regular.  Patient is a 60 y.o. female presenting with headaches. The history is provided by the patient.  Headache Associated symptoms: no abdominal pain, no back pain, no congestion, no fever, no neck pain, no neck stiffness and no vomiting     Past Medical History  Diagnosis Date  . Cancer [facility]) 2001    left breast  . Cancer [facility]    Past Surgical History  Procedure Laterality Date  . Breast surgery Left 2001    lumpectomy with radiation therapy and chemo therapy  . Tibia fracture surgery  2014    rod placed  . Abdominal hysterectomy  2005    total  . Oophorectomy Bilateral 2005  . Cesarean section  1984  . Shoulder surgery  2008   Family History  Problem Relation Age of Onset  . Liver cancer Father   . Breast cancer Mother   . Narcolepsy Brother    Social History  Substance Use Topics  . Smoking status: Current Every Day Smoker -- 0.50 packs/day for 20 years    Types: Cigarettes  . Smokeless tobacco: Never Used  . Alcohol Use: Yes   OB History    Gravida Para Term Preterm AB TAB SAB Ectopic Multiple Living   2 2        2       Obstetric Comments   Menstrual age:  23  Age 1st Pregnancy: 23     Review of Systems  Constitutional: Negative for fever and chills.  HENT: Negative for congestion.   Eyes: Positive for visual disturbance.  Respiratory: Negative for shortness of breath.   Cardiovascular: Negative for chest pain.  Gastrointestinal: Negative for vomiting and abdominal pain.  Genitourinary: Negative for dysuria and flank pain.  Musculoskeletal: Negative for back pain, neck pain and neck stiffness.  Skin: Negative for rash.  Neurological: Positive for headaches. Negative for light-headedness.      Allergies  Biaxin; Penicillins; and Sulfa antibiotics  Home Medications   Prior to Admission medications   Medication Sig Start Date End Date Taking? Authorizing Provider  BIOTIN 5000 PO Take 1 capsule by mouth daily.   Yes Historical Provider, MD  buPROPion (WELLBUTRIN SR) 150 MG 12 hr tablet Take 150 mg by mouth daily.   Yes Historical Provider, MD  Calcium Carbonate-Vitamin D (CALCIUM + D PO) Take 1 capsule by mouth daily.   Yes Historical Provider, MD  cetirizine (ZYRTEC) 10 MG tablet Take 10 mg by mouth daily.   Yes Historical Provider, MD  citalopram (CELEXA) 40 MG tablet Take 40 mg by mouth daily.   Yes Historical Provider, MD  clonazePAM (KLONOPIN) 0.5 MG tablet Take 1.5 mg by mouth every evening.   Yes Historical Provider, MD  fluticasone (FLONASE) 50  MCG/ACT nasal spray Place into both nostrils daily.   Yes Historical Provider, MD  Melatonin 5 MG CAPS Take 1 capsule by mouth every evening.   Yes Historical Provider, MD  metoCLOPramide (REGLAN) 10 MG tablet Take 1 tablet (10 mg total) by mouth every 8 (eight) hours as needed for nausea (headache, take with benadryl). 12/26/15   Elnora Morrison, MD   BP 104/69 mmHg  Pulse 62  Temp(Src) 97.7 F (36.5 C) (Oral)  Resp 16  Ht 5\' 8"  (1.727 m)  Wt 162 lb (73.483 kg)  BMI 24.64 kg/m2  SpO2 100% Physical Exam  Constitutional: She is oriented to person, place, and time. She appears  well-developed and well-nourished.  HENT:  Head: Normocephalic and atraumatic.  Eyes: Conjunctivae are normal. Right eye exhibits no discharge. Left eye exhibits no discharge.  Neck: Normal range of motion. Neck supple. No tracheal deviation present.  Cardiovascular: Normal rate and regular rhythm.   Pulmonary/Chest: Effort normal and breath sounds normal.  Abdominal: Soft. She exhibits no distension. There is no tenderness. There is no guarding.  Musculoskeletal: She exhibits no edema.  Neurological: She is alert and oriented to person, place, and time. Coordination normal.  5+ strength in UE and LE with f/e at major joints. Sensation to palpation intact in UE and LE. CNs 2-12 grossly intact.  EOMFI.  PERRL.   Finger nose and coordination intact bilateral.   Visual fields intact to finger testing. No nystagmus   Skin: Skin is warm. No rash noted.  Psychiatric: She has a normal mood and affect.  Nursing note and vitals reviewed.   ED Course  Procedures (including critical care time) Labs Review Labs Reviewed  CBC  URINALYSIS, ROUTINE W REFLEX MICROSCOPIC (NOT AT [facility])  COMPREHENSIVE METABOLIC PANEL    Imaging Review Mr Brain Wo Contrast  12/26/2015  CLINICAL DATA:  60 year old female with daily headaches and peripheral visual loss for the past 2-3 months. Initial encounter. EXAM: MRI HEAD WITHOUT CONTRAST TECHNIQUE: Multiplanar, multiecho pulse sequences of the brain and surrounding structures were obtained without intravenous contrast. COMPARISON:  No comparison brain MR or head CT. Comparison cervical spine MR 11/21/2008. FINDINGS: No acute infarct or intracranial hemorrhage. Scattered nonspecific punctate and patchy white matter changes, confluent in a periventricular region and involving the pons in a distribution most suggestive of result of small vessel disease. Other causes white matter changes including that secondary to vasculitis, demyelinating process, inflammatory process  or migraine headaches felt to be much less likely possibilities. Mild atrophy without hydrocephalus. No intracranial mass lesion noted on this unenhanced exam. Major intracranial vascular structures are patent. No acute primary orbital abnormality. Pituitary region and cervical medullary junction unremarkable. Left maxillary sinus mucosal thickening. IMPRESSION: No acute infarct. 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 Left maxillary sinus mucosal thickening. Electronically Signed   By: Genia Del M.D.   On: 12/26/2015 12:59   I have personally reviewed and evaluated these images and lab results as part of my medical decision-making.   EKG Interpretation None      MDM   Final diagnoses:  Vision changes  Headache, unspecified headache type   Well-appearing female presents with intermittent peripheral vision decreased and headache. Normal neurologic exam at this time. Patient is not having one of her episodes. Plan for MRI brain, screening blood work and if unremarkable outpatient follow-up with neurology. Headache cocktail ordered.  MRI unremarkable. Patient stable for outpatient follow-up with neurology.  Results and  differential diagnosis were discussed with the patient/parent/guardian. Xrays were independently reviewed by myself.  Close follow up outpatient was discussed, comfortable with the plan.   Medications  fentaNYL (SUBLIMAZE) injection 50 mcg (50 mcg Intravenous Given 12/26/15 1023)  sodium chloride 0.9 % bolus 500 mL (0 mLs Intravenous Stopped 12/26/15 1119)  metoCLOPramide (REGLAN) injection 10 mg (10 mg Intravenous Given 12/26/15 1015)  diphenhydrAMINE (BENADRYL) injection 25 mg (25 mg Intravenous Given 12/26/15 1009)  acetaminophen (TYLENOL) tablet 1,000 mg (1,000 mg Oral Given 12/26/15 1151)    Filed Vitals:   12/26/15 0915 12/26/15 1015 12/26/15 1030 12/26/15 1130  BP: 111/75 130/81 110/69 104/69  Pulse: 61 58  60 62  Temp:      TempSrc:      Resp:      Height:      Weight:      SpO2: 96% 99% 98% 100%    Final diagnoses:  Vision changes  Headache, unspecified headache type        Elnora Morrison, MD 12/26/15 1307

## 2015-12-26 NOTE — ED Notes (Signed)
Visual Acuity:  R eye-20/200 L eye-20/50 Both-20/40 (completed with reading glasses)

## 2015-12-26 NOTE — ED Notes (Signed)
Patient transported to MRI 

## 2016-01-30 DIAGNOSIS — Z853 Personal history of malignant neoplasm of breast: Secondary | ICD-10-CM

## 2016-01-30 HISTORY — DX: Personal history of malignant neoplasm of breast: Z85.3

## 2016-02-02 ENCOUNTER — Ambulatory Visit: Payer: BLUE CROSS/BLUE SHIELD | Admitting: Neurology

## 2016-02-08 ENCOUNTER — Other Ambulatory Visit: Payer: Self-pay | Admitting: Pediatrics

## 2016-02-08 DIAGNOSIS — Z1231 Encounter for screening mammogram for malignant neoplasm of breast: Secondary | ICD-10-CM

## 2016-02-17 ENCOUNTER — Encounter: Payer: Self-pay | Admitting: Neurology

## 2016-02-17 ENCOUNTER — Ambulatory Visit
Admission: RE | Admit: 2016-02-17 | Discharge: 2016-02-17 | Disposition: A | Discharge status: Home / Self Care | Payer: BLUE CROSS/BLUE SHIELD | Source: Ambulatory Visit | Attending: Pediatrics | Admitting: Pediatrics

## 2016-02-17 ENCOUNTER — Telehealth: Payer: Self-pay | Admitting: Neurology

## 2016-02-17 ENCOUNTER — Ambulatory Visit (INDEPENDENT_AMBULATORY_CARE_PROVIDER_SITE_OTHER): Payer: BLUE CROSS/BLUE SHIELD | Admitting: Neurology

## 2016-02-17 VITALS — BP 100/60 | HR 62 | Ht 67.0 in | Wt 165.2 lb

## 2016-02-17 DIAGNOSIS — R2689 Other abnormalities of gait and mobility: Secondary | ICD-10-CM

## 2016-02-17 DIAGNOSIS — Z1231 Encounter for screening mammogram for malignant neoplasm of breast: Secondary | ICD-10-CM

## 2016-02-17 DIAGNOSIS — G43109 Migraine with aura, not intractable, without status migrainosus: Secondary | ICD-10-CM | POA: Insufficient documentation

## 2016-02-17 DIAGNOSIS — G4440 Drug-induced headache, not elsewhere classified, not intractable: Secondary | ICD-10-CM

## 2016-02-17 DIAGNOSIS — R51 Headache: Secondary | ICD-10-CM

## 2016-02-17 DIAGNOSIS — G4441 Drug-induced headache, not elsewhere classified, intractable: Secondary | ICD-10-CM

## 2016-02-17 DIAGNOSIS — R519 Headache, unspecified: Secondary | ICD-10-CM

## 2016-02-17 MED ORDER — CYCLOBENZAPRINE HCL 5 MG PO TABS: 5.0000 mg | ORAL_TABLET | Freq: Every evening | ORAL | 5 refills | Status: DC | PRN

## 2016-02-17 MED ORDER — PREDNISONE 10 MG PO TABS: ORAL_TABLET | ORAL | 0 refills | Status: DC

## 2016-02-17 MED ORDER — TOPIRAMATE 25 MG PO TABS: 25.0000 mg | ORAL_TABLET | Freq: Every day | ORAL | 5 refills | Status: DC

## 2016-02-17 NOTE — Progress Notes (Signed)
Megan Rollins - Initial Visit   Date: 02/17/16  Megan Rollins MRN: [id number] DOB: [birth year]   Dear Dr. Drema Dallas:  Thank you for your kind referral of Megan Rollins for consultation of headache. Although her history is well known to you, please allow [place] to reiterate it for the purpose of our medical record. The patient was accompanied to the clinic by self.    History of Present Illness: Megan Rollins is a 60 y.o. right-handed Caucasian female with left breast cancer s/p lumpectomy, chemo/radiation (2001), anxiety/depression, and seasonal allergies presenting for evaluation of headaches.    Starting in May 2017, she was driving home one day and began seeing flashing lights over the left peripheral vision and then began having tunnel vision followed by immediate severe headache.  Pain was so severe, lasting up to two days.  Since then, she has different headaches occurring daily.  She was getting developed band-like sensation across her head which has been constant and pounding. Along with this she has had one headache associated with syncope one on July 31st when she was talking to a coworker and fell on the floor.  She did not loose consciousness, because she could hear her cooworkers talking to her.  She was immediately taken to cardiology and her EKG showed bradycardia.  She is wearing a cardiac monitor.  She was told not to work or drive for [id number].  On other occasions, she had experienced disoriented, dizzy, and felt that she needed to sit down.  Immediately, she developed severe headache.  She had a spell on June 19th which occurred for 1 hour and 10 minutes.    In July, she reports having one spell of has heavy pressure in her chest, neck, tingling in the arms, lightheadedness, and heart rate and blood pressure will drop.    She is seeing cardiology for this.   She now has severe pounding pain at the base of her head.  No  nausea/vomiting.  She endorses photophobia and phonophobia.  She has not noticed any triggers or warning signs.  No vision changes, dysarthria, numbness/tingling, or weakness.  She takes OTC migraine medication which has a lot of caffeine.  She has relief with this within 20-minutes.  She takes 1 tablet daily. She has not been on any preventative medication.    She presented to the ER on 12/26/2015 with 88-month history of intermittent peripheral vision field changes and pressure-like headache. MRI brain showed mild scattered white matter changes.  She was given a headache cocktail which helped and told to follow-up with neurology.   She was previously evaluated by Dr. Leta Baptist at [facility] in October 2015 for migraines and spells of lightheadedness, dizziness, difficulty speaking, blurred vision, transient visual loss, then followed by headache. MRI brain and MRA head and neck was essentially normal.  Patient went for follow-up visit, but after waiting a long time to be seen, decided to leave and did not return to discuss preventative medications.  She reports that her headaches were work-related and once she left her work, headache resolved.  She also has RLS and was taking requip and her PCP is trying to wean her off clonazepam.   Out-side paper records, electronic medical record, and images have been reviewed where available and summarized as:  MRI brain wo contrast 12/26/2015: No acute infarct.  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  Left maxillary sinus mucosal thickening.  MRI brain  04/23/2014: 1. Multiple round and ovoid periventricular and subcortical and pontine T2 hyperintensities. No abnormal lesions are seen on post contrast views. These findings are non-specific and considerations include autoimmune, inflammatory, post-infectious, microvascular ischemic or migraine associated etiologies.  2. No acute findings.  MRA head and neck  04/23/2014:  Normal  Past Medical History:  Diagnosis Date  . Cancer [facility]) 2001   left breast  . Cancer ([place])   . Depression     Past Surgical History:  Procedure Laterality Date  . ABDOMINAL HYSTERECTOMY  2005   total  . BREAST SURGERY Left 2001   lumpectomy with radiation therapy and chemo therapy  . CESAREAN SECTION  1984  . OOPHORECTOMY Bilateral 2005  . SHOULDER SURGERY  2008  . TIBIA FRACTURE SURGERY  2014   rod placed     Medications:  Outpatient Encounter Prescriptions as of 02/17/2016  Medication Sig  . BIOTIN 5000 PO Take 1 capsule by mouth daily.  . Calcium Carbonate-Vitamin D (CALCIUM + D PO) Take 1 capsule by mouth daily.  . citalopram (CELEXA) 40 MG tablet Take 40 mg by mouth daily.  . clonazePAM (KLONOPIN) 0.5 MG tablet Take 2 mg by mouth every evening.   . Melatonin 5 MG CAPS Take 1 capsule by mouth every evening.  Marland Kitchen buPROPion (WELLBUTRIN SR) 150 MG 12 hr tablet Take 150 mg by mouth daily.  . cetirizine (ZYRTEC) 10 MG tablet Take 10 mg by mouth daily.  . fluticasone (FLONASE) 50 MCG/ACT nasal spray Place into both nostrils daily.  . metoCLOPramide (REGLAN) 10 MG tablet Take 1 tablet (10 mg total) by mouth every 8 (eight) hours as needed for nausea (headache, take with benadryl). (Patient not taking: Reported on 02/17/2016)   No facility-administered encounter medications on file as of 02/17/2016.      Allergies:  Allergies  Allergen Reactions  . Biaxin [Clarithromycin] Nausea Only  . Penicillins Rash  . Sulfa Antibiotics Rash    Family History: Family History  Problem Relation Age of Onset  . Liver cancer Father   . Alcohol abuse Father   . Breast cancer Mother   . Heart attack Mother   . Heart attack Brother   . Narcolepsy Brother   . Heart attack Brother   . Alcohol abuse Sister   . Hepatitis Sister   . Healthy Daughter   . Other Son     Social History: Social History  Substance Use Topics  . Smoking status: Never Smoker  . Smokeless  tobacco: Never Used  . Alcohol use Yes     Comment: Seldom   Social History   Social History Narrative   Lives with husband and stepdaughter in a one story home.  Has 2 children.  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    Review of Systems:  CONSTITUTIONAL: No fevers, chills, night sweats, or weight loss.   EYES: No visual changes or eye pain ENT: No hearing changes.  No history of nose bleeds.   RESPIRATORY: No cough, wheezing and shortness of breath.   CARDIOVASCULAR: Negative for chest pain, and palpitations.   GI: Negative for abdominal discomfort, blood in stools or black stools.  No recent change in bowel habits.   GU:  No history of incontinence.   MUSCLOSKELETAL: No history of joint pain or swelling.  No myalgias.   SKIN: Negative for lesions, rash, and itching.   HEMATOLOGY/ONCOLOGY: Negative for prolonged bleeding, bruising easily, and swollen nodes.  No history of cancer.   ENDOCRINE:  Negative for cold or heat intolerance, polydipsia or goiter.   PSYCH:  +depression or anxiety symptoms.   NEURO: As Above.   Vital Signs:  BP 100/60   Pulse 62   Ht 5\' 7"  (1.702 m)   Wt 165 lb 3 oz (74.9 kg)   SpO2 96%   BMI 25.87 kg/m    General Medical Exam:   General:  Well appearing, comfortable.   Eyes/ENT: see cranial nerve examination.   Neck: No masses appreciated.  Full range of motion without tenderness.  No carotid bruits. Respiratory:  Clear to auscultation, good air entry bilaterally.   Cardiac:  Regular rate and rhythm, no murmur.   Extremities:  No deformities, edema, or skin discoloration.  Skin:  No rashes or lesions.  Neurological Exam: MENTAL STATUS including orientation to time, place, person, recent and remote memory, attention span and concentration, language, and fund of knowledge is normal.  Speech is not dysarthric.  CRANIAL NERVES: II:  No visual field defects.  Unremarkable fundi.   III-IV-VI: Pupils equal round and reactive to light.  Normal conjugate,  extra-ocular eye movements in all directions of gaze.  No nystagmus.  No ptosis.   V:  Normal facial sensation.   VII:  Normal facial symmetry and movements.  VIII:  Normal hearing and vestibular function.   IX-X:  Normal palatal movement.   XI:  Normal shoulder shrug and head rotation.   XII:  Normal tongue strength and range of motion, no deviation or fasciculation.  MOTOR:  Motor strength is 5/5 throughout.   No atrophy, fasciculations or abnormal movements.  No pronator drift.  Tone is normal.    MSRs:  Right                                                                 Left brachioradialis 2+  brachioradialis 2+  biceps 2+  biceps 2+  triceps 2+  triceps 2+  patellar 2+  patellar 2+  ankle jerk 2+  ankle jerk 2+  Hoffman no  Hoffman no  plantar response down  plantar response down   SENSORY:  Temperature is diminished at the feet bilaterally, otherwise, light touch, pinprick, vibration, and proprioception is intact.  There is mild sway with Romberg testing.  COORDINATION/GAIT: Normal finger-to- nose-finger and heel-to-shin.  Intact rapid alternating movements bilaterally.  Able to rise from a chair without using arms.  Gait narrow based and stable.  She is unsteady with tandem gait.  Stressed gait intact.   IMPRESSION: 1.  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  Spells are not consistent with seizure disorder. 2.  Chronic daily headache 3.  Medication overuse headaches 4.  Gait imbalance, patient feels this is associated with her migraines  PLAN/RECOMMENDATIONS:  1.  Start prednisone taper pack over one week 2.  Start topiramate 25mg  daily.  Common side effects discussed 3.  For severe neck pain, flexeril 5mg  at bedtime.  Triptans are contraindicated in basilar migraine 4.  Neck physiotherapy 5.  Limit all rescue medication to twice per week 6.  If balance does not improve with better headache  control, will recommend NCS/EMG of the legs  Return to clinic in 3 months.   The duration of this  appointment visit was 60 minutes of face-to-face time with the patient.  Greater than 50% of this time was spent in counseling, explanation of diagnosis, planning of further management, and coordination of care.   Thank you for allowing me to participate in patient's care.  If I can answer any additional questions, I would be pleased to do so.    Sincerely,    Bentlie Withem K. Posey Pronto, DO

## 2016-02-17 NOTE — Telephone Encounter (Signed)
Left message informing patient that I have cleared up the mix up with her medicine.  Dr. Posey Pronto always orders one extra prednisone.

## 2016-02-17 NOTE — Progress Notes (Signed)
Note routed

## 2016-02-17 NOTE — Patient Instructions (Signed)
1.  Start prednisone taper pack over one week 2.  Start topiramate 25mg  daily 3.  For severe neck pain, flexeril 5mg  at bedtime 4.  Neck physiotherapy 5.  Limit all rescue medication to twice per week 6.  If your balance get worse, call my office and we can schedule nerve testing 7.  Call with an update in 1 month  Return to clinic in 3 months

## 2016-02-17 NOTE — Telephone Encounter (Signed)
PT called and has a message regarding her prescription/Dawn CB#[phone]

## 2016-02-18 ENCOUNTER — Encounter: Payer: Self-pay | Admitting: Neurology

## 2016-02-21 ENCOUNTER — Encounter: Payer: Self-pay | Admitting: Neurology

## 2016-03-02 ENCOUNTER — Ambulatory Visit: Payer: BLUE CROSS/BLUE SHIELD | Admitting: Neurology

## 2016-03-12 DIAGNOSIS — G2581 Restless legs syndrome: Secondary | ICD-10-CM | POA: Insufficient documentation

## 2016-03-20 ENCOUNTER — Telehealth: Payer: Self-pay | Admitting: *Deleted

## 2016-03-20 NOTE — Telephone Encounter (Signed)
Noted  

## 2016-03-20 NOTE — Telephone Encounter (Signed)
Patient called and left message that she is doing fine on Topirimate and Flexeril.  She has not had a migraine since starting them.

## 2016-03-30 ENCOUNTER — Telehealth: Payer: Self-pay | Admitting: Neurology

## 2016-03-30 MED ORDER — ONDANSETRON HCL 4 MG PO TABS: 4.0000 mg | ORAL_TABLET | Freq: Three times a day (TID) | ORAL | 2 refills | Status: DC | PRN

## 2016-03-30 MED ORDER — TOPIRAMATE 25 MG PO TABS: 50.0000 mg | ORAL_TABLET | Freq: Every day | ORAL | 3 refills | Status: DC

## 2016-03-30 NOTE — Telephone Encounter (Signed)
Make sure not fever, chills, otherwise sick.  Is double vision gone?  Only on topamax 25 mg daily?  If so, increase to 50 mg.

## 2016-03-30 NOTE — Telephone Encounter (Signed)
Called pt. No fever, chills, or any other signs/symptoms of acute illness. Double vision has subsided, lasted about 45 minutes. Pt was only on 25 mg of topamax and new RX for 50 sent to pharmacy. Zofran okay to send in as well?

## 2016-03-30 NOTE — Telephone Encounter (Signed)
Ok to call in Zofran 4mg  q8hr prn for nausea, #30 with 2 refills. Thanks

## 2016-03-30 NOTE — Telephone Encounter (Signed)
Zofran Rx sent to pharmac.

## 2016-03-30 NOTE — Telephone Encounter (Signed)
Megan Rollins 02/25/2056. She is a patient of Dr Posey Pronto. She said she has had 3 Breakthrough migraines in 3 days with nausea. She would like you to please call her back at [phone]. Thank you

## 2016-03-30 NOTE — Telephone Encounter (Signed)
Called and spoke to patient. Pt has been doing really well. No migraines in the last month and a half (since medication was changed) - Flexeril 5 mg and Topiramate 25 mg. However, the last 3 days she has had a migraine. On Wednesday she woke with neck pain, that by noon that transitioned into a full migraine with nausea (never experienced w/ migraine before)ess, and double vision. 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 Migraine resolved, but repeated the following day. Today pt woke up with full migraine. Pt wondering if she has built a tolerance to the Topamax or something? Pt also requesting prescription for Zofran. Please advise.

## 2016-04-26 ENCOUNTER — Ambulatory Visit (INDEPENDENT_AMBULATORY_CARE_PROVIDER_SITE_OTHER): Payer: BLUE CROSS/BLUE SHIELD | Admitting: *Deleted

## 2016-04-26 ENCOUNTER — Telehealth: Payer: Self-pay | Admitting: Neurology

## 2016-04-26 DIAGNOSIS — R51 Headache: Secondary | ICD-10-CM

## 2016-04-26 DIAGNOSIS — R519 Headache, unspecified: Secondary | ICD-10-CM

## 2016-04-26 MED ORDER — METOCLOPRAMIDE HCL 5 MG/ML IJ SOLN
10.0000 mg | Freq: Once | INTRAMUSCULAR | Status: AC
  Administered 2016-04-26: 10 mg via INTRAVENOUS

## 2016-04-26 MED ORDER — KETOROLAC TROMETHAMINE 60 MG/2ML IM SOLN
30.0000 mg | Freq: Once | INTRAMUSCULAR | Status: AC
  Administered 2016-04-26: 30 mg via INTRAMUSCULAR

## 2016-04-26 NOTE — Telephone Encounter (Signed)
She can come into the office for nurse visit for Toradol 30mg  injection and Reglan 10mg  for her migraine.  Megan Rollins K. Posey Pronto, DO

## 2016-04-26 NOTE — Telephone Encounter (Signed)
Patient does not wish to come in for injection.  Discussed this with Dr. Posey Pronto and she would like for patient to increase Topamax to 75 mg daily.  Patient agreed with plan.

## 2016-04-26 NOTE — Telephone Encounter (Signed)
This is the patient that called after hours.

## 2016-04-26 NOTE — Telephone Encounter (Signed)
Patient states that she has had a migraine for the last 7 days please call [phone]

## 2016-05-18 ENCOUNTER — Encounter: Payer: Self-pay | Admitting: Neurology

## 2016-05-18 ENCOUNTER — Ambulatory Visit (INDEPENDENT_AMBULATORY_CARE_PROVIDER_SITE_OTHER): Payer: BLUE CROSS/BLUE SHIELD | Admitting: Neurology

## 2016-05-18 ENCOUNTER — Other Ambulatory Visit: Payer: Self-pay | Admitting: *Deleted

## 2016-05-18 VITALS — BP 110/70 | HR 94 | Ht 67.0 in | Wt 165.5 lb

## 2016-05-18 DIAGNOSIS — G43109 Migraine with aura, not intractable, without status migrainosus: Secondary | ICD-10-CM

## 2016-05-18 MED ORDER — TOPIRAMATE 50 MG PO TABS: 75.0000 mg | ORAL_TABLET | Freq: Every day | ORAL | 11 refills | Status: DC

## 2016-05-18 NOTE — Progress Notes (Signed)
Follow-up Visit   Date: 05/18/16    Megan Rollins MRN: [id number] DOB: 1955/11/08   Interim History: Megan Rollins is a 60 y.o. right-handed Caucasian female with left breast cancer s/p lumpectomy, chemo/radiation (2001), anxiety/depression, and seasonal allergies returning to the clinic for follow-up of basilar migraines.  The patient was accompanied to the clinic by self.  History of present illness: Starting in May 2017, she was driving home one day and began seeing flashing lights over the left peripheral vision and then began having tunnel vision followed by immediate severe headache.  Pain was so severe, lasting up to two days.  Since then, she has different headaches occurring daily.  She was getting developed band-like sensation across her head which has been constant and pounding. Along with this she has had one headache associated with syncope one on July 31st when she was talking to a coworker and fell on the floor.  She did not loose consciousness, because she could hear her cooworkers talking to her.  She was immediately taken to cardiology and her EKG showed bradycardia.  She is wearing a cardiac monitor.  She was told not to work or drive for [id number].  On other occasions, she had experienced disoriented, dizzy, and felt that she needed to sit down.  Immediately, she developed severe headache.  She had a spell on June 19th which occurred for 1 hour and 10 minutes.  In July, she reports having one spell of has heavy pressure in her chest, neck, tingling in the arms, lightheadedness, and heart rate and blood pressure will drop.   She now has severe pounding pain at the base of her head, with photophobia and phonophobia. No vision changes, dysarthria, numbness/tingling, or weakness.  She takes OTC migraine medication which has a lot of caffeine.  She has relief with this within 20-minutes.  She takes 1 tablet daily. She has not been on any preventative medication.     She presented to the ER on 12/26/2015 with 23-month history of intermittent peripheral vision field changes and pressure-like headache. MRI brain showed mild scattered white matter changes.  She was given a headache cocktail which helped and told to follow-up with neurology.   She was previously evaluated by Dr. Leta Baptist at [facility] in October 2015 for migraines and spells of lightheadedness, dizziness, difficulty speaking, blurred vision, transient visual loss, then followed by headache. MRI brain and MRA head and neck was essentially normal.  Patient went for follow-up visit, but after waiting a long time to be seen, decided to leave and did not return to discuss preventative medications.  She reports that her headaches were work-related and once she left her work, headache resolved.  She also has RLS and was taking requip and her PCP is trying to wean her off clonazepam.   UPDATE 05/18/2016:   She reports doing well with prednisone taper which broke her headache cycle and reduced the intensity of her headaches.  She is tolerating topirmate very well but in mid-October, she started having a spell of vertigo and severe headache again.  She came to the office for toradol injection which alleviated her pain.  Her topiramate was also increased to 75mg  daily and she has not had a headache in the past 3 weeks.  Overall, she is doing very well and is extremely pleased with the effectiveness of the headache medication.   Medications:  Current Outpatient Prescriptions on File Prior to Visit  Medication Sig Dispense Refill  . BIOTIN  5000 PO Take 1 capsule by mouth daily.    Marland Kitchen buPROPion (WELLBUTRIN SR) 150 MG 12 hr tablet Take 150 mg by mouth daily.    . Calcium Carbonate-Vitamin D (CALCIUM + D PO) Take 1 capsule by mouth daily.    . cetirizine (ZYRTEC) 10 MG tablet Take 10 mg by mouth daily.    . citalopram (CELEXA) 40 MG tablet Take 40 mg by mouth daily.    . clonazePAM (KLONOPIN) 0.5 MG tablet Take 2 mg  by mouth every evening.     . cyclobenzaprine (FLEXERIL) 5 MG tablet Take 1 tablet (5 mg total) by mouth at bedtime as needed for muscle spasms. 30 tablet 5  . fluticasone (FLONASE) 50 MCG/ACT nasal spray Place into both nostrils daily.    . Melatonin 5 MG CAPS Take 1 capsule by mouth every evening.    . metoCLOPramide (REGLAN) 10 MG tablet Take 1 tablet (10 mg total) by mouth every 8 (eight) hours as needed for nausea (headache, take with benadryl). 10 tablet 0  . ondansetron (ZOFRAN) 4 MG tablet Take 1 tablet (4 mg total) by mouth every 8 (eight) hours as needed for nausea or vomiting. 20 tablet 2   No current facility-administered medications on file prior to visit.     Allergies:  Allergies  Allergen Reactions  . Biaxin [Clarithromycin] Nausea Only  . Penicillins Rash  . Sulfa Antibiotics Rash    Review of Systems:  CONSTITUTIONAL: No fevers, chills, night sweats, or weight loss.  EYES: No visual changes or eye pain ENT: No hearing changes.  No history of nose bleeds.   RESPIRATORY: No cough, wheezing and shortness of breath.   CARDIOVASCULAR: Negative for chest pain, and palpitations.   GI: Negative for abdominal discomfort, blood in stools or black stools.  No recent change in bowel habits.   GU:  No history of incontinence.   MUSCLOSKELETAL: No history of joint pain or swelling.  No myalgias.   SKIN: Negative for lesions, rash, and itching.   ENDOCRINE: Negative for cold or heat intolerance, polydipsia or goiter.   PSYCH:  No depression or anxiety symptoms.   NEURO: As Above.   Vital Signs:  BP 110/70   Pulse 94   Ht 5\' 7"  (1.702 m)   Wt 165 lb 8 oz (75.1 kg)   SpO2 98%   BMI 25.92 kg/m    Neurological Exam: MENTAL STATUS including orientation to time, place, person, recent and remote memory, attention span and concentration, language, and fund of knowledge is normal.  Speech is not dysarthric.  CRANIAL NERVES: Pupils equal round and reactive to light.  Normal  conjugate, extra-ocular eye movements in all directions of gaze.  No ptosis.  Face is symmetric. Palate elevates symmetrically.  Tongue is midline.  MOTOR:  Motor strength is 5/5 in all extremities    COORDINATION/GAIT:   Gait narrow based and stable.   Data: MRI brain wo contrast 12/26/2015: No acute infarct.  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  Left maxillary sinus mucosal thickening.  MRI brain 04/23/2014: 1. Multiple round and ovoid periventricular and subcortical and pontine T2 hyperintensities. No abnormal lesions are seen on post contrast views. These findings are non-specific and considerations include autoimmune, inflammatory, post-infectious, microvascular ischemic or migraine associated etiologies.  2. No acute findings.  MRA head and neck 04/23/2014:  Normal  IMPRESSION/PLAN: 1.  Basilar migraine manifesting with episodic dizziness, disorientation, vision changes, and presyncope.  Clinically doing extremely well!  - Continue topiramate 75mg  daily  - Continue flexeril 5mg  at bedtime  - For acute headache, she has responded well to prednisone taper, toradol 2.  Chronic daily headache - resolved 3.  Medication overuse headaches - resolved 4.  Gait imbalance, patient feels this is associated with her migraines - resolved  Return to clinic in 6 months  The duration of this appointment visit was 25 minutes of face-to-face time with the patient.  Greater than 50% of this time was spent in counseling, explanation of diagnosis, planning of further management, and coordination of care.   Thank you for allowing me to participate in patient's care.  If I can answer any additional questions, I would be pleased to do so.    Sincerely,    Arthea Nobel K. Posey Pronto, DO

## 2016-05-18 NOTE — Patient Instructions (Signed)
1.  Continue topiramate 75mg  daily  2.  Continue flexeril 5mg  at bedtime  Return to clinic in 6 months

## 2016-08-06 ENCOUNTER — Other Ambulatory Visit: Payer: Self-pay | Admitting: Neurology

## 2016-09-03 ENCOUNTER — Telehealth: Payer: Self-pay | Admitting: Neurology

## 2016-09-03 ENCOUNTER — Other Ambulatory Visit: Payer: Self-pay | Admitting: *Deleted

## 2016-09-03 MED ORDER — CYCLOBENZAPRINE HCL 5 MG PO TABS: ORAL_TABLET | ORAL | 5 refills | Status: DC

## 2016-09-03 NOTE — Telephone Encounter (Signed)
Patient needs her flexeril 5 mg called into the [facility] patient phone number is [phone]

## 2016-09-03 NOTE — Telephone Encounter (Signed)
Rx sent 

## 2016-11-23 ENCOUNTER — Encounter: Payer: Self-pay | Admitting: Neurology

## 2016-11-23 ENCOUNTER — Ambulatory Visit (INDEPENDENT_AMBULATORY_CARE_PROVIDER_SITE_OTHER): Payer: BC Managed Care – PPO | Admitting: Neurology

## 2016-11-23 VITALS — BP 110/70 | HR 77 | Ht 67.0 in | Wt 167.5 lb

## 2016-11-23 DIAGNOSIS — G43109 Migraine with aura, not intractable, without status migrainosus: Secondary | ICD-10-CM | POA: Diagnosis not present

## 2016-11-23 MED ORDER — TOPIRAMATE 25 MG PO TABS: 25.0000 mg | ORAL_TABLET | Freq: Every day | ORAL | 3 refills | Status: DC

## 2016-11-23 NOTE — Progress Notes (Signed)
Follow-up Visit   Date: 11/23/16    Megan Rollins MRN: [id number] DOB: Apr 01, 1956   Interim History: Megan Rollins is a 61 y.o. right-handed Caucasian female with left breast cancer s/p lumpectomy, chemo/radiation (2001), anxiety/depression, and seasonal allergies returning to the clinic for follow-up of basilar migraines.  The patient was accompanied to the clinic by self.  History of present illness: Starting in May 2017, she was driving home one day and began seeing flashing lights over the left peripheral vision and then began having tunnel vision followed by immediate severe headache.  Pain was so severe, lasting up to two days.  Since then, she has different headaches occurring daily.  She was getting developed band-like sensation across her head which has been constant and pounding. Along with this she has had one headache associated with syncope one on July 31st when she was talking to a coworker and fell on the floor.  She did not loose consciousness, because she could hear her cooworkers talking to her.  She was immediately taken to cardiology and her EKG showed bradycardia.  She is wearing a cardiac monitor.  She was told not to work or drive for 62-VOJJ.  On other occasions, she had experienced disoriented, dizzy, and felt that she needed to sit down.  Immediately, she developed severe headache.  She had a spell on June 19th which occurred for 1 hour and 10 minutes.  In July, she reports having one spell of has heavy pressure in her chest, neck, tingling in the arms, lightheadedness, and heart rate and blood pressure will drop.   She now has severe pounding pain at the base of her head, with photophobia and phonophobia. No vision changes, dysarthria, numbness/tingling, or weakness.  She takes OTC migraine medication which has a lot of caffeine.  She has relief with this within 20-minutes.  She takes 1 tablet daily. She has not been on any preventative medication.     She presented to the ER on 12/26/2015 with 15-month history of intermittent peripheral vision field changes and pressure-like headache. MRI brain showed mild scattered white matter changes.  She was given a headache cocktail which helped and told to follow-up with neurology.   She was previously evaluated by Dr. Leta Baptist at [facility] in October 2015 for migraines and spells of lightheadedness, dizziness, difficulty speaking, blurred vision, transient visual loss, then followed by headache. MRI brain and MRA head and neck was essentially normal.  Patient went for follow-up visit, but after waiting a long time to be seen, decided to leave and did not return to discuss preventative medications.  She reports that her headaches were work-related and once she left her work, headache resolved.  She also has RLS and was taking requip and her PCP is trying to wean her off clonazepam.   UPDATE 05/18/2016:   She reports doing well with prednisone taper which broke her headache cycle and reduced the intensity of her headaches.  She is tolerating topirmate very well but in mid-October, she started having a spell of vertigo and severe headache again.  She came to the office for toradol injection which alleviated her pain.  Her topiramate was also increased to 75mg  daily and she has not had a headache in the past 3 weeks.  Overall, she is doing very well and is extremely pleased with the effectiveness of the headache medication.   UPDATE 11/23/2016:  She is here for 52-month appointment and is doing well.  She self tapered topiramate  to 50mg  in December which controls her headaches extremely well. She has not had any further headaches, vision changes, or dizziness.  She continues to take flexeril 5mg  at bedtime which helps with her neck spasm and tightness and tried skipping it one day, but noticed that it worsened her neck stiffness, so has been taking this nightly.  She reports having a kidney stone in April and has never  had this before.   Medications:  Current Outpatient Prescriptions on File Prior to Visit  Medication Sig Dispense Refill  . alendronate (FOSAMAX) 70 MG tablet Take 70 mg by mouth.    Marland Kitchen BIOTIN 5000 PO Take 1 capsule by mouth daily.    . Calcium Carbonate-Vitamin D (CALCIUM + D PO) Take 1 capsule by mouth daily.    . cetirizine (ZYRTEC) 10 MG tablet Take 10 mg by mouth daily.    . citalopram (CELEXA) 40 MG tablet Take 40 mg by mouth daily.    . cyclobenzaprine (FLEXERIL) 5 MG tablet TAKE ONE TABLET BY MOUTH AT BEDTIME AS NEEDED FOR MUSCLE SPASM 30 tablet 5  . fluticasone (FLONASE) 50 MCG/ACT nasal spray Place into both nostrils daily.    . Melatonin 5 MG CAPS Take 1 capsule by mouth every evening.    . metoCLOPramide (REGLAN) 10 MG tablet Take 1 tablet (10 mg total) by mouth every 8 (eight) hours as needed for nausea (headache, take with benadryl). 10 tablet 0  . ondansetron (ZOFRAN) 4 MG tablet Take 1 tablet (4 mg total) by mouth every 8 (eight) hours as needed for nausea or vomiting. 20 tablet 2   No current facility-administered medications on file prior to visit.     Allergies:  Allergies  Allergen Reactions  . Biaxin [Clarithromycin] Nausea Only  . Penicillins Rash  . Sulfa Antibiotics Rash    Review of Systems:  CONSTITUTIONAL: No fevers, chills, night sweats, or weight loss.  EYES: No visual changes or eye pain ENT: No hearing changes.  No history of nose bleeds.   RESPIRATORY: No cough, wheezing and shortness of breath.   CARDIOVASCULAR: Negative for chest pain, and palpitations.   GI: Negative for abdominal discomfort, blood in stools or black stools.  No recent change in bowel habits.   GU:  No history of incontinence.   MUSCLOSKELETAL: No history of joint pain or swelling.  No myalgias.   SKIN: Negative for lesions, rash, and itching.   ENDOCRINE: Negative for cold or heat intolerance, polydipsia or goiter.   PSYCH:  No depression or anxiety symptoms.   NEURO: As  Above.   Vital Signs:  BP 110/70   Pulse 77   Ht 5\' 7"  (1.702 m)   Wt 167 lb 8 oz (76 kg)   SpO2 98%   BMI 26.23 kg/m   Neurological Exam: MENTAL STATUS including orientation to time, place, person, recent and remote memory, attention span and concentration, language, and fund of knowledge is normal.  Speech is nornmal.  CRANIAL NERVES: Pupils equal round and reactive to light.  Normal conjugate, extra-ocular eye movements in all directions of gaze.  No ptosis.  Face is symmetric. Palate elevates symmetrically.  Tongue is midline.  MOTOR:  Motor strength is 5/5 in all extremities    COORDINATION/GAIT:   Gait narrow based and stable.   Data: MRI brain wo contrast 12/26/2015: No acute infarct.  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  Left maxillary sinus mucosal thickening.  MRA head and  neck 04/23/2014:  Normal  IMPRESSION/PLAN: Basilar migraine manifesting with episodic dizziness, disorientation, vision changes, and presyncope.   Clinically doing extremely well!  - Reduce topiramate 25mg  daily due to kidney stone and to keep her on the lowest dose that controls symptoms  - Continue flexeril 5mg  at bedtime  - For acute headache, she has responded well to prednisone taper, toradol  - Encouraged to stay well-hydrated  Return to clinic in 6 months  The duration of this appointment visit was 25 minutes of face-to-face time with the patient.  Greater than 50% of this time was spent in counseling, explanation of diagnosis, planning of further management, and coordination of care.   Thank you for allowing me to participate in patient's care.  If I can answer any additional questions, I would be pleased to do so.    Sincerely,    Donika K. Posey Pronto, DO

## 2016-11-23 NOTE — Patient Instructions (Signed)
Reduce topiramate to 25mg  daily Continue flexeril 5mg  at bedtime Stay well-hydrated  Return to clinic in 6 months

## 2017-04-01 ENCOUNTER — Other Ambulatory Visit: Payer: Self-pay | Admitting: Pediatrics

## 2017-04-01 DIAGNOSIS — Z1231 Encounter for screening mammogram for malignant neoplasm of breast: Secondary | ICD-10-CM

## 2017-04-09 ENCOUNTER — Ambulatory Visit: Payer: BC Managed Care – PPO

## 2017-04-10 ENCOUNTER — Ambulatory Visit
Admission: RE | Admit: 2017-04-10 | Discharge: 2017-04-10 | Disposition: A | Discharge status: Home / Self Care | Payer: BC Managed Care – PPO | Source: Ambulatory Visit | Attending: Pediatrics | Admitting: Pediatrics

## 2017-04-10 DIAGNOSIS — Z1231 Encounter for screening mammogram for malignant neoplasm of breast: Secondary | ICD-10-CM

## 2017-06-14 ENCOUNTER — Ambulatory Visit: Payer: BC Managed Care – PPO | Admitting: Neurology

## 2018-01-06 DIAGNOSIS — M810 Age-related osteoporosis without current pathological fracture: Secondary | ICD-10-CM | POA: Insufficient documentation

## 2018-02-28 ENCOUNTER — Other Ambulatory Visit: Payer: Self-pay | Admitting: Internal Medicine

## 2018-02-28 DIAGNOSIS — Z1231 Encounter for screening mammogram for malignant neoplasm of breast: Secondary | ICD-10-CM

## 2018-04-11 ENCOUNTER — Ambulatory Visit
Admission: RE | Admit: 2018-04-11 | Discharge: 2018-04-11 | Disposition: A | Discharge status: Home / Self Care | Payer: BC Managed Care – PPO | Source: Ambulatory Visit | Attending: Internal Medicine | Admitting: Internal Medicine

## 2018-04-11 DIAGNOSIS — Z1231 Encounter for screening mammogram for malignant neoplasm of breast: Secondary | ICD-10-CM

## 2019-01-13 ENCOUNTER — Other Ambulatory Visit: Payer: Self-pay | Admitting: Nurse Practitioner

## 2019-01-13 DIAGNOSIS — Z1231 Encounter for screening mammogram for malignant neoplasm of breast: Secondary | ICD-10-CM

## 2019-04-08 ENCOUNTER — Other Ambulatory Visit: Payer: Self-pay | Admitting: Orthopedic Surgery

## 2019-04-21 ENCOUNTER — Other Ambulatory Visit: Payer: Self-pay

## 2019-04-21 ENCOUNTER — Ambulatory Visit
Admission: RE | Admit: 2019-04-21 | Discharge: 2019-04-21 | Disposition: A | Discharge status: Home / Self Care | Payer: BC Managed Care – PPO | Source: Ambulatory Visit | Attending: Nurse Practitioner | Admitting: Nurse Practitioner

## 2019-04-21 DIAGNOSIS — Z1231 Encounter for screening mammogram for malignant neoplasm of breast: Secondary | ICD-10-CM

## 2019-05-06 DIAGNOSIS — M25512 Pain in left shoulder: Secondary | ICD-10-CM | POA: Insufficient documentation

## 2019-05-06 HISTORY — DX: Pain in left shoulder: M25.512

## 2019-06-09 DIAGNOSIS — M75120 Complete rotator cuff tear or rupture of unspecified shoulder, not specified as traumatic: Secondary | ICD-10-CM | POA: Insufficient documentation

## 2019-06-09 HISTORY — DX: Complete rotator cuff tear or rupture of unspecified shoulder, not specified as traumatic: M75.120

## 2019-06-11 ENCOUNTER — Encounter: Payer: Self-pay | Admitting: Orthopedic Surgery

## 2019-06-15 ENCOUNTER — Other Ambulatory Visit
Admission: RE | Admit: 2019-06-15 | Discharge: 2019-06-15 | Disposition: A | Discharge status: Home / Self Care | Payer: BC Managed Care – PPO | Source: Ambulatory Visit | Attending: Orthopedic Surgery | Admitting: Orthopedic Surgery

## 2019-06-15 ENCOUNTER — Other Ambulatory Visit: Payer: Self-pay

## 2019-06-15 DIAGNOSIS — Z20828 Contact with and (suspected) exposure to other viral communicable diseases: Secondary | ICD-10-CM | POA: Diagnosis not present

## 2019-06-15 DIAGNOSIS — Z01812 Encounter for preprocedural laboratory examination: Secondary | ICD-10-CM | POA: Diagnosis present

## 2019-06-15 LAB — SARS CORONAVIRUS 2 (TAT 6-24 HRS): SARS Coronavirus 2: NEGATIVE

## 2019-06-18 ENCOUNTER — Ambulatory Visit: Payer: BC Managed Care – PPO | Admitting: Anesthesiology

## 2019-06-18 ENCOUNTER — Other Ambulatory Visit: Payer: Self-pay

## 2019-06-18 ENCOUNTER — Encounter: Payer: Self-pay | Admitting: Orthopedic Surgery

## 2019-06-18 ENCOUNTER — Encounter
Admission: RE | Disposition: A | Discharge status: Home / Self Care | Payer: Self-pay | Source: Home / Self Care | Attending: Orthopedic Surgery

## 2019-06-18 ENCOUNTER — Ambulatory Visit
Admission: RE | Admit: 2019-06-18 | Discharge: 2019-06-18 | Disposition: A | Discharge status: Home / Self Care | Payer: BC Managed Care – PPO | Attending: Orthopedic Surgery | Admitting: Orthopedic Surgery

## 2019-06-18 DIAGNOSIS — Z87891 Personal history of nicotine dependence: Secondary | ICD-10-CM | POA: Diagnosis not present

## 2019-06-18 DIAGNOSIS — M75121 Complete rotator cuff tear or rupture of right shoulder, not specified as traumatic: Secondary | ICD-10-CM | POA: Diagnosis present

## 2019-06-18 HISTORY — DX: Personal history of other diseases of the nervous system and sense organs: Z86.69

## 2019-06-18 HISTORY — DX: Unspecified osteoarthritis, unspecified site: M19.90

## 2019-06-18 HISTORY — PX: SHOULDER ARTHROSCOPY WITH OPEN ROTATOR CUFF REPAIR: SHX6092

## 2019-06-18 SURGERY — ARTHROSCOPY, SHOULDER WITH REPAIR, ROTATOR CUFF, OPEN
Anesthesia: Regional | Site: Shoulder | Laterality: Right

## 2019-06-18 MED ORDER — PROPOFOL 10 MG/ML IV BOLUS
INTRAVENOUS | Status: DC | PRN
  Administered 2019-06-18: 130 mg via INTRAVENOUS

## 2019-06-18 MED ORDER — EPHEDRINE SULFATE 50 MG/ML IJ SOLN
INTRAMUSCULAR | Status: DC | PRN
  Administered 2019-06-18 (×10): 5 mg via INTRAVENOUS

## 2019-06-18 MED ORDER — BUPIVACAINE LIPOSOME 1.3 % IJ SUSP
INTRAMUSCULAR | Status: DC | PRN
  Administered 2019-06-18: 266 mg via PERINEURAL

## 2019-06-18 MED ORDER — GLYCOPYRROLATE 0.2 MG/ML IJ SOLN
INTRAMUSCULAR | Status: DC | PRN
  Administered 2019-06-18: .1 mg via INTRAVENOUS

## 2019-06-18 MED ORDER — FENTANYL CITRATE (PF) 100 MCG/2ML IJ SOLN
INTRAMUSCULAR | Status: DC | PRN
  Administered 2019-06-18: 50 ug via INTRAVENOUS

## 2019-06-18 MED ORDER — CLINDAMYCIN PHOSPHATE 900 MG/50ML IV SOLN
900.0000 mg | INTRAVENOUS | Status: AC
  Administered 2019-06-18: 900 mg via INTRAVENOUS

## 2019-06-18 MED ORDER — ONDANSETRON HCL 4 MG/2ML IJ SOLN
INTRAMUSCULAR | Status: DC | PRN
  Administered 2019-06-18: 4 mg via INTRAVENOUS

## 2019-06-18 MED ORDER — LACTATED RINGERS IV SOLN
10.0000 mL/h | INTRAVENOUS | Status: DC
  Administered 2019-06-18: 10 mL/h via INTRAVENOUS

## 2019-06-18 MED ORDER — BUPIVACAINE HCL (PF) 0.5 % IJ SOLN
INTRAMUSCULAR | Status: DC | PRN
  Administered 2019-06-18: 100 mg via PERINEURAL

## 2019-06-18 MED ORDER — HYDROCODONE-ACETAMINOPHEN 5-325 MG PO TABS: 1.0000 | ORAL_TABLET | ORAL | 0 refills | Status: DC | PRN

## 2019-06-18 MED ORDER — LIDOCAINE HCL (CARDIAC) PF 100 MG/5ML IV SOSY
PREFILLED_SYRINGE | INTRAVENOUS | Status: DC | PRN
  Administered 2019-06-18: 40 mg via INTRATRACHEAL

## 2019-06-18 MED ORDER — MIDAZOLAM HCL 2 MG/2ML IJ SOLN
INTRAMUSCULAR | Status: DC | PRN
  Administered 2019-06-18: 2 mg via INTRAVENOUS

## 2019-06-18 MED ORDER — CHLORHEXIDINE GLUCONATE 4 % EX LIQD: 60.0000 mL | Freq: Once | CUTANEOUS | Status: DC

## 2019-06-18 MED ORDER — DEXAMETHASONE SODIUM PHOSPHATE 4 MG/ML IJ SOLN
INTRAMUSCULAR | Status: DC | PRN
  Administered 2019-06-18: 4 mg via INTRAVENOUS

## 2019-06-18 SURGICAL SUPPLY — 52 items
ANCH SUT 2 4.75 2 THRD PEEK (Anchor) ×1 IMPLANT
ANCHOR SUT CROSSFT 4.75 (Anchor) ×2 IMPLANT
APL PRP STRL LF DISP 70% ISPRP (MISCELLANEOUS) ×1
BLADE FULL RADIUS 3.5 (BLADE) ×3 IMPLANT
BUR BR 5.5 WIDE MOUTH (BURR) ×2 IMPLANT
CANNULA TWIST IN 8.25X9CM (CANNULA) ×3 IMPLANT
CHLORAPREP W/TINT 26 (MISCELLANEOUS) ×3 IMPLANT
COOLER POLAR GLACIER W/PUMP (MISCELLANEOUS) ×3 IMPLANT
COVER WAND RF STERILE (DRAPES) ×3 IMPLANT
DRAPE INCISE IOBAN 66X45 STRL (DRAPES) ×3 IMPLANT
DRAPE STERI 35X30 U-POUCH (DRAPES) ×3 IMPLANT
DRAPE U-SHAPE 48X52 POLY STRL (PACKS) ×2 IMPLANT
ELECT REM PT RETURN 9FT ADLT (ELECTROSURGICAL) ×3
ELECTRODE REM PT RTRN 9FT ADLT (ELECTROSURGICAL) ×1 IMPLANT
GAUZE SPONGE 4X4 12PLY STRL (GAUZE/BANDAGES/DRESSINGS) ×3 IMPLANT
GAUZE XEROFORM 1X8 LF (GAUZE/BANDAGES/DRESSINGS) ×3 IMPLANT
GLOVE BIOGEL PI IND STRL 8 (GLOVE) ×1 IMPLANT
GLOVE BIOGEL PI INDICATOR 8 (GLOVE) ×2
GLOVE SURG ORTHO 8.0 STRL STRW (GLOVE) ×3 IMPLANT
GOWN STRL REUS W/ TWL LRG LVL3 (GOWN DISPOSABLE) ×1 IMPLANT
GOWN STRL REUS W/ TWL XL LVL3 (GOWN DISPOSABLE) ×1 IMPLANT
GOWN STRL REUS W/TWL LRG LVL3 (GOWN DISPOSABLE) ×3
GOWN STRL REUS W/TWL XL LVL3 (GOWN DISPOSABLE) ×3
IV LACTATED RINGER IRRG 3000ML (IV SOLUTION) ×18
IV LR IRRIG 3000ML ARTHROMATIC (IV SOLUTION) IMPLANT
KIT STABILIZATION SHOULDER (MISCELLANEOUS) ×3 IMPLANT
KIT TURNOVER KIT A (KITS) ×3 IMPLANT
MANIFOLD NEPTUNE II (INSTRUMENTS) ×4 IMPLANT
MASK FACE SPIDER DISP (MASK) ×3 IMPLANT
MAT ABSORB  FLUID 56X50 GRAY (MISCELLANEOUS) ×2
MAT ABSORB FLUID 56X50 GRAY (MISCELLANEOUS) ×1 IMPLANT
NDL AUTO PASS (NEEDLE) IMPLANT
NDL SAFETY ECLIPSE 18X1.5 (NEEDLE) ×1 IMPLANT
NDL SPNL 18GX3.5 QUINCKE PK (NEEDLE) ×1 IMPLANT
NEEDLE AUTO PASS (NEEDLE) ×3 IMPLANT
NEEDLE HYPO 18GX1.5 SHARP (NEEDLE) ×3
NEEDLE HYPO 22GX1.5 SAFETY (NEEDLE) ×3 IMPLANT
NEEDLE SPNL 18GX3.5 QUINCKE PK (NEEDLE) ×6 IMPLANT
PACK ARTHROSCOPY SHOULDER (MISCELLANEOUS) ×3 IMPLANT
PAD WRAPON POLAR SHDR UNIV (MISCELLANEOUS) IMPLANT
SLING ARM M TX990204 (SOFTGOODS) ×2 IMPLANT
SLING ULTRA II M (MISCELLANEOUS) ×2 IMPLANT
SUT ETHILON 4 0 PS 2 18 (SUTURE) ×2 IMPLANT
SYR 10ML LL (SYRINGE) ×3 IMPLANT
SYR 50ML LL SCALE MARK (SYRINGE) ×3 IMPLANT
TAPE HI-FI 2MM COBRAID WHT BLK (SUTURE) ×2 IMPLANT
TAPE MICROFOAM 4IN (TAPE) ×3 IMPLANT
TUBING ARTHRO INFLOW-ONLY STRL (TUBING) ×3 IMPLANT
TUBING CONNECTING 10 (TUBING) ×2 IMPLANT
TUBING CONNECTING 10' (TUBING) ×1
WAND WEREWOLF FLOW 90D (MISCELLANEOUS) ×3 IMPLANT
WRAPON POLAR PAD SHDR UNIV (MISCELLANEOUS) ×3

## 2019-06-18 NOTE — Anesthesia Procedure Notes (Signed)
Procedure Name: LMA Insertion Date/Time: 06/18/2019 12:37 PM Performed by: Mayme Genta, CRNA Pre-anesthesia Checklist: Patient identified, Emergency Drugs available, Suction available, Timeout performed and Patient being monitored Patient Re-evaluated:Patient Re-evaluated prior to induction Oxygen Delivery Method: Circle system utilized Preoxygenation: Pre-oxygenation with 100% oxygen Induction Type: IV induction LMA: LMA inserted LMA Size: 4.0 Number of attempts: 1 Placement Confirmation: positive ETCO2 and breath sounds checked- equal and bilateral Tube secured with: Tape

## 2019-06-18 NOTE — Op Note (Signed)
06/18/2019  2:20 PM  PATIENT:  Megan Rollins  63 y.o. female  PRE-OPERATIVE DIAGNOSIS:  M75.121 complete rotator cuff tear or rupture of right shoulder  POST-OPERATIVE DIAGNOSIS:  M75.121 complete rotator cuff tear or rupture of right shoulder  PROCEDURE:  Procedure(s): SHOULDER ARTHROSCOPIC ROTATOR CUFF REPAIR, DISTAL CLAVICLE EXCISION, SUBACROMIAL DECOMPRESSION, INTRA-ARTICULAR DEBRIDEMENT (Right)  SURGEON:  Surgeon(s) and Role:    Lovell Sheehan, MD - Primary  ASSIST: none  ANESTHESIA:   regional and general   PREOPERATIVE INDICATIONS:  Megan Rollins is a  63 y.o. female with a diagnosis of M75.121 complete rotator cuff tear or rupture of right shoulder who failed conservative measures and elected for surgical management.    The risks benefits and alternatives were discussed with the patient preoperatively including but not limited to the risks of infection, bleeding, nerve injury, persistent pain or weakness, failure of the hardware, re-tear of the rotator cuff and the need for further surgery. Medical risks include DVT and pulmonary embolism, myocardial infarction, stroke, pneumonia, respiratory failure and death. Patient understood these risks and wished to proceed.  OPERATIVE IMPLANTS: [employer] SpeedBridge System  OPERATIVE PROCEDURE: The patient was met in the preoperative area. The right shoulder was signed with my initials according the hospital's correct site of surgery protocol. The patient is brought to the OR and underwent a supraclavicular block and general endotracheal intubation by the anesthesia service.  The patient was placed in a beachchair position.  A spider arm positioner was used for this case. Examination under anesthesia revealed negative sulcus sign, no anterior/posterior instability.  The patient was prepped and draped in a sterile fashion. A timeout was performed to verify the patient's name, date of birth, medical record number, correct site of  surgery and correct procedure to be performed there was also used to verify the patient received antibiotics that all appropriate instruments, implants and radiographs studies were available in the room. Once all in attendance were in agreement case began.  Bony landmarks were drawn out with a surgical marker along with proposed arthroscopy incisions. These were pre-injected with 1% lidocaine plain. An 11 blade was used to establish a posterior portal through which the arthroscope was placed in the glenohumeral joint. A full diagnostic examination of the shoulder was performed.  The anterior portal was established under direct visualization with an 18-gauge spinal needle.  A 5.75 mm arthroscopic cannula was placed through the anterior portal.   A prior biceps tenotomy had been performed. The arthroscopic shaver was then used to debride the frayed edges of the labrum. There were no anterior or superior labral tears seen.  Subscapularis tendon was intact with small superior partial tearing which was debrided. Patient had a near full-thickness tear involving the supraspinatus without retraction. There were no loose bodies within the inferior recess and no evidence of HAGL lesion.  The arthroscope was then placed in the subacromial space. A lateral portal was then established using an 18-gauge spinal needle for localization.   The greater tuberosity was debrided using a 5.5 mm resector shaver blade to remove all remaining foreign fibers of the rotator cuff.  Debridement was performed until punctate bleeding was seen at the greater tuberosity footprint, which will allow for rotator cuff healing.  Extensive bursitis was encountered and debrided using a 4-0 resector shaver blade and a 90 ArthroCare wand from the lateral portal. The cuff was mobilized and the tape passed through the rotator cuff. The tape was then crossed in usual fashion and  fixated on the lateral side with one crossFT anchor. The final construct  was stable and moved as a unit with excellent coverage of the humeral head.  Using the burr, a limited subacromial decompression was performed, followed by resection of 8 mm of distal clavicle through the anterior portal.  All incisions were copiously irrigated. Skin closure for the arthroscopic incisions was performed with 4-0 nylon.  A dry sterile dressing including Steri-Strips was applied .  The patient was placed in an abduction sling.  All sharp and instrument counts were correct at the conclusion of the case. I was scrubbed and present for the entire case. I spoke with the patient's family in the post-op consultation room and informed them that the case had been performed without complication and the patient was stable in recovery room.   Kurtis Bushman, MD

## 2019-06-18 NOTE — Anesthesia Preprocedure Evaluation (Addendum)
Anesthesia Evaluation  Patient identified by MRN, date of birth, ID band Patient awake    History of Anesthesia Complications Negative for: history of anesthetic complications  Airway Mallampati: I  TM Distance: >3 FB Neck ROM: Full    Dental no notable dental hx.    Pulmonary neg pulmonary ROS,    Pulmonary exam normal        Cardiovascular Exercise Tolerance: Good negative cardio ROS    NM study 2017 normal   Neuro/Psych  Headaches,    GI/Hepatic negative GI ROS, Neg liver ROS,   Endo/Other  negative endocrine ROS  Renal/GU negative Renal ROS     Musculoskeletal   Abdominal   Peds  Hematology negative hematology ROS (+)   Anesthesia Other Findings   Reproductive/Obstetrics                            Anesthesia Physical Anesthesia Plan  ASA: II  Anesthesia Plan: General and Regional   Post-op Pain Management: GA combined w/ Regional for post-op pain   Induction: Intravenous  PONV Risk Score and Plan: 3 and TIVA, Propofol infusion, Midazolam and Treatment may vary due to age or medical condition  Airway Management Planned: Nasal Cannula and Natural Airway  Additional Equipment: None  Intra-op Plan:   Post-operative Plan:   Informed Consent: I have reviewed the patients History and Physical, chart, labs and discussed the procedure including the risks, benefits and alternatives for the proposed anesthesia with the patient or authorized representative who has indicated his/her understanding and acceptance.       Plan Discussed with:   Anesthesia Plan Comments:         Anesthesia Quick Evaluation

## 2019-06-18 NOTE — Transfer of Care (Signed)
Immediate Anesthesia Transfer of Care Note  Patient: Megan Rollins  Procedure(s) Performed: SHOULDER ARTHROSCOPIC ROTATOR CUFF REPAIR, DISTAL CLAVICLE EXCISION, SUBACHROMIAL DECOMPRESSION, INTRA-ARTICULAR DEBRIDEMENT (Right Shoulder)  Patient Location: PACU  Anesthesia Type: General, Regional  Level of Consciousness: awake, alert  and patient cooperative  Airway and Oxygen Therapy: Patient Spontanous Breathing and Patient connected to supplemental oxygen  Post-op Assessment: Post-op Vital signs reviewed, Patient's Cardiovascular Status Stable, Respiratory Function Stable, Patent Airway and No signs of Nausea or vomiting  Post-op Vital Signs: Reviewed and stable  Complications: No apparent anesthesia complications

## 2019-06-18 NOTE — H&P (Signed)
The patient has been re-examined, and the chart reviewed, and there have been no interval changes to the documented history and physical.  Plan a right shoulder scope today.  Anesthesia is consulted regarding a peripheral nerve block for post-operative pain.  The risks, benefits, and alternatives have been discussed at length, and the patient is willing to proceed.    

## 2019-06-18 NOTE — Anesthesia Procedure Notes (Signed)
Anesthesia Regional Block: Interscalene brachial plexus block   Pre-Anesthetic Checklist: ,, timeout performed, Correct Patient, Correct Site, Correct Laterality, Correct Procedure, Correct Position, site marked, Risks and benefits discussed,  Surgical consent,  Pre-op evaluation,  At surgeon's request and post-op pain management  Laterality: Right  Prep: chloraprep       Needles:  Injection technique: Single-shot  Needle Type: Stimiplex     Needle Length: 10cm  Needle Gauge: 21     Additional Needles:   Procedures:,,,, ultrasound used (permanent image in chart),,,,  Narrative:  Start time: 06/18/2019 11:30 AM End time: 06/18/2019 11:39 AM Injection made incrementally with aspirations every 5 mL.  Performed by: Personally   Additional Notes: Functioning IV was confirmed and monitors applied. Ultrasound guidance: relevant anatomy identified, needle position confirmed, local anesthetic spread visualized around nerve(s)., vascular puncture avoided.  Image printed for medical record.  Negative aspiration and no paresthesias; incremental administration of local anesthetic. The patient tolerated the procedure well. Vitals signes recorded in RN notes.

## 2019-06-18 NOTE — Discharge Instructions (Signed)
Wear sling at all times, including sleep.  You will need to use the sling for a total of 4 weeks following surgery.  Do not try and lift your arm up or away from your body for any reason.   Keep the dressing dry.  You may remove bandage in 3 days.  You may place Band-Aids over top of the incisions.  May shower once dressing is removed in 3 days.  Remove sling carefully only for showers, leaving arm down by your side while in the shower.  +++ Make sure to take some pain medication this evening before you fall asleep, in preparation for the nerve block wearing off in the middle of the night.  If the the pain medication causes itching, or is too strong, try taking a single tablet at a time, or combining with Benadryl.  You may be most comfortable sleeping in a recliner.  If you do sleep in near bed, placed pillows behind the shoulder that have the operation to support it.    General Anesthesia, Adult, Care After This sheet gives you information about how to care for yourself after your procedure. Your health care provider may also give you more specific instructions. If you have problems or questions, contact your health care provider. What can I expect after the procedure? After the procedure, the following side effects are common:  Pain or discomfort at the IV site.  Nausea.  Vomiting.  Sore throat.  Trouble concentrating.  Feeling cold or chills.  Weak or tired.  Sleepiness and fatigue.  Soreness and body aches. These side effects can affect parts of the body that were not involved in surgery. Follow these instructions at home:  For at least 24 hours after the procedure:  Have a responsible adult stay with you. It is important to have someone help care for you until you are awake and alert.  Rest as needed.  Do not: ? Participate in activities in which you could fall or become injured. ? Drive. ? Use heavy machinery. ? Drink alcohol. ? Take sleeping pills or  medicines that cause drowsiness. ? Make important decisions or sign legal documents. ? Take care of children on your own. Eating and drinking  Follow any instructions from your health care provider about eating or drinking restrictions.  When you feel hungry, start by eating small amounts of foods that are soft and easy to digest (bland), such as toast. Gradually return to your regular diet.  Drink enough fluid to keep your urine pale yellow.  If you vomit, rehydrate by drinking water, juice, or clear broth. General instructions  If you have sleep apnea, surgery and certain medicines can increase your risk for breathing problems. Follow instructions from your health care provider about wearing your sleep device: ? Anytime you are sleeping, including during daytime naps. ? While taking prescription pain medicines, sleeping medicines, or medicines that make you drowsy.  Return to your normal activities as told by your health care provider. Ask your health care provider what activities are safe for you.  Take over-the-counter and prescription medicines only as told by your health care provider.  If you smoke, do not smoke without supervision.  Keep all follow-up visits as told by your health care provider. This is important. Contact a health care provider if:  You have nausea or vomiting that does not get better with medicine.  You cannot eat or drink without vomiting.  You have pain that does not get better with medicine.  You are unable to pass urine.  You develop a skin rash.  You have a fever.  You have redness around your IV site that gets worse. Get help right away if:  You have difficulty breathing.  You have chest pain.  You have blood in your urine or stool, or you vomit blood. Summary  After the procedure, it is common to have a sore throat or nausea. It is also common to feel tired.  Have a responsible adult stay with you for the first 24 hours after general  anesthesia. It is important to have someone help care for you until you are awake and alert.  When you feel hungry, start by eating small amounts of foods that are soft and easy to digest (bland), such as toast. Gradually return to your regular diet.  Drink enough fluid to keep your urine pale yellow.  Return to your normal activities as told by your health care provider. Ask your health care provider what activities are safe for you. This information is not intended to replace advice given to you by your health care provider. Make sure you discuss any questions you have with your health care provider. Document Released: 09/24/2000 Document Revised: 06/21/2017 Document Reviewed: 02/01/2017 Elsevier Patient Education  2020 [employer].

## 2019-06-19 ENCOUNTER — Encounter: Payer: Self-pay | Admitting: *Deleted

## 2019-07-06 NOTE — Anesthesia Postprocedure Evaluation (Signed)
Anesthesia Post Note  Patient: Megan Rollins  Procedure(s) Performed: SHOULDER ARTHROSCOPIC ROTATOR CUFF REPAIR, DISTAL CLAVICLE EXCISION, SUBACHROMIAL DECOMPRESSION, INTRA-ARTICULAR DEBRIDEMENT (Right Shoulder)     Anesthesia Post Evaluation  Adele Barthel Noraa Pickeral

## 2020-02-17 ENCOUNTER — Other Ambulatory Visit: Payer: BC Managed Care – PPO

## 2020-02-19 ENCOUNTER — Other Ambulatory Visit: Payer: BC Managed Care – PPO

## 2020-03-08 ENCOUNTER — Other Ambulatory Visit: Payer: Self-pay | Admitting: Nurse Practitioner

## 2020-03-08 DIAGNOSIS — Z1231 Encounter for screening mammogram for malignant neoplasm of breast: Secondary | ICD-10-CM

## 2020-03-14 ENCOUNTER — Inpatient Hospital Stay: Admission: RE | Admit: 2020-03-14 | Payer: BC Managed Care – PPO | Source: Ambulatory Visit

## 2020-03-16 ENCOUNTER — Other Ambulatory Visit: Payer: Self-pay | Admitting: Orthopedic Surgery

## 2020-03-16 ENCOUNTER — Encounter: Payer: Self-pay | Admitting: Orthopedic Surgery

## 2020-03-16 ENCOUNTER — Other Ambulatory Visit: Payer: BC Managed Care – PPO

## 2020-03-16 NOTE — H&P (Signed)
NAME: Megan Rollins MRN:   [id number] DOB:   11-10-1955     HISTORY AND PHYSICAL  CHIEF COMPLAINT:  Right shoulder pain  HISTORY:   Megan Hanna McPhersonis a 64 y.o. female  with right  Shoulder Pain Patient complains of right shoulder pain. The symptoms began several months ago. Aggravating factors: injury while doing PT. Pain is located diffusely throughout the shoulder. Discomfort is described as aching. Symptoms are exacerbated by repetitive movements and overhead movements.Plan for right shoulder arthroscopy  PAST MEDICAL HISTORY:   Past Medical History:  Diagnosis Date  . Arthritis    shoulder  . Cancer [facility]) 2001   left breast  . Cancer ([place])   . Depression   . Hx of migraines     PAST SURGICAL HISTORY:   Past Surgical History:  Procedure Laterality Date  . ABDOMINAL HYSTERECTOMY  2005   total  . BREAST LUMPECTOMY Left    chemo and radiation 2001  . BREAST SURGERY Left 2001   lumpectomy with radiation therapy and chemo therapy  . CESAREAN SECTION  1984  . OOPHORECTOMY Bilateral 2005  . SHOULDER ARTHROSCOPY WITH OPEN ROTATOR CUFF REPAIR Right 06/18/2019   Procedure: SHOULDER ARTHROSCOPIC ROTATOR CUFF REPAIR, DISTAL CLAVICLE EXCISION, SUBACHROMIAL DECOMPRESSION, INTRA-ARTICULAR DEBRIDEMENT;  Surgeon: Lovell Sheehan, MD;  Location: [facility];  Service: Orthopedics;  Laterality: Right;  . SHOULDER SURGERY  2008  . TIBIA FRACTURE SURGERY  2014   rod placed    MEDICATIONS:  (Not in a hospital admission)   ALLERGIES:   Allergies  Allergen Reactions  . Biaxin [Clarithromycin] Nausea Only  . Penicillins Rash  . Sulfa Antibiotics Rash    REVIEW OF SYSTEMS:   Negative except HPI  FAMILY HISTORY:   Family History  Problem Relation Age of Onset  . Liver cancer Father   . Alcohol abuse Father   . Breast cancer Mother   . Heart attack Mother   . Heart attack Brother   . Narcolepsy Brother   . Heart attack Brother   . Alcohol abuse Sister   .  Hepatitis Sister   . Healthy Daughter   . Other Son     SOCIAL HISTORY:   reports that she has never smoked. She has never used smokeless tobacco. She reports current alcohol use. She reports that she does not use drugs.  PHYSICAL EXAM:  General appearance: alert, cooperative and no distress Neck: no JVD and supple, symmetrical, trachea midline Resp: clear to auscultation bilaterally Cardio: regular rate and rhythm, S1, S2 normal, no murmur, click, rub or gallop GI: soft, non-tender; bowel sounds normal; no masses,  no organomegaly Extremities: extremities normal, atraumatic, no cyanosis or edema and Homans sign is negative, no sign of DVT Pulses: 2+ and symmetric Skin: Skin color, texture, turgor normal. No rashes or lesions Neurologic: Alert and oriented X 3, normal strength and tone. Normal symmetric reflexes. Normal coordination and gait    LABORATORY STUDIES: No results for input(s): WBC, HGB, HCT, PLT in the last 72 hours.  No results for input(s): NA, K, CL, CO2, GLUCOSE, BUN, CREATININE, CALCIUM in the last 72 hours.  STUDIES/RESULTS:  No results found.  ASSESSMENT: Right rotator cuff tear   PLAN: Right shoulder arthroscopy with rotator cuff repair  Carlynn Spry 03/16/2020. 10:53 AM

## 2020-03-16 NOTE — H&P (View-Only) (Signed)
NAME: Megan Rollins MRN:   [id number] DOB:   1956-05-11     HISTORY AND PHYSICAL  CHIEF COMPLAINT:  Right shoulder pain  HISTORY:   Megan Pacifico McPhersonis a 64 y.o. female  with right  Shoulder Pain Patient complains of right shoulder pain. The symptoms began several months ago. Aggravating factors: injury while doing PT. Pain is located diffusely throughout the shoulder. Discomfort is described as aching. Symptoms are exacerbated by repetitive movements and overhead movements.Plan for right shoulder arthroscopy  PAST MEDICAL HISTORY:   Past Medical History:  Diagnosis Date  . Arthritis    shoulder  . Cancer [facility]) 2001   left breast  . Cancer ([place])   . Depression   . Hx of migraines     PAST SURGICAL HISTORY:   Past Surgical History:  Procedure Laterality Date  . ABDOMINAL HYSTERECTOMY  2005   total  . BREAST LUMPECTOMY Left    chemo and radiation 2001  . BREAST SURGERY Left 2001   lumpectomy with radiation therapy and chemo therapy  . CESAREAN SECTION  1984  . OOPHORECTOMY Bilateral 2005  . SHOULDER ARTHROSCOPY WITH OPEN ROTATOR CUFF REPAIR Right 06/18/2019   Procedure: SHOULDER ARTHROSCOPIC ROTATOR CUFF REPAIR, DISTAL CLAVICLE EXCISION, SUBACHROMIAL DECOMPRESSION, INTRA-ARTICULAR DEBRIDEMENT;  Surgeon: Lovell Sheehan, MD;  Location: [facility];  Service: Orthopedics;  Laterality: Right;  . SHOULDER SURGERY  2008  . TIBIA FRACTURE SURGERY  2014   rod placed    MEDICATIONS:  (Not in a hospital admission)   ALLERGIES:   Allergies  Allergen Reactions  . Biaxin [Clarithromycin] Nausea Only  . Penicillins Rash  . Sulfa Antibiotics Rash    REVIEW OF SYSTEMS:   Negative except HPI  FAMILY HISTORY:   Family History  Problem Relation Age of Onset  . Liver cancer Father   . Alcohol abuse Father   . Breast cancer Mother   . Heart attack Mother   . Heart attack Brother   . Narcolepsy Brother   . Heart attack Brother   . Alcohol abuse Sister   .  Hepatitis Sister   . Healthy Daughter   . Other Son     SOCIAL HISTORY:   reports that she has never smoked. She has never used smokeless tobacco. She reports current alcohol use. She reports that she does not use drugs.  PHYSICAL EXAM:  General appearance: alert, cooperative and no distress Neck: no JVD and supple, symmetrical, trachea midline Resp: clear to auscultation bilaterally Cardio: regular rate and rhythm, S1, S2 normal, no murmur, click, rub or gallop GI: soft, non-tender; bowel sounds normal; no masses,  no organomegaly Extremities: extremities normal, atraumatic, no cyanosis or edema and Homans sign is negative, no sign of DVT Pulses: 2+ and symmetric Skin: Skin color, texture, turgor normal. No rashes or lesions Neurologic: Alert and oriented X 3, normal strength and tone. Normal symmetric reflexes. Normal coordination and gait    LABORATORY STUDIES: No results for input(s): WBC, HGB, HCT, PLT in the last 72 hours.  No results for input(s): NA, K, CL, CO2, GLUCOSE, BUN, CREATININE, CALCIUM in the last 72 hours.  STUDIES/RESULTS:  No results found.  ASSESSMENT: Right rotator cuff tear   PLAN: Right shoulder arthroscopy with rotator cuff repair  Carlynn Spry 03/16/2020. 10:53 AM

## 2020-03-17 ENCOUNTER — Other Ambulatory Visit: Payer: BC Managed Care – PPO

## 2020-03-17 ENCOUNTER — Other Ambulatory Visit: Payer: Self-pay

## 2020-03-17 ENCOUNTER — Encounter
Admission: RE | Admit: 2020-03-17 | Discharge: 2020-03-17 | Disposition: A | Discharge status: Home / Self Care | Payer: BC Managed Care – PPO | Source: Ambulatory Visit | Attending: Orthopedic Surgery | Admitting: Orthopedic Surgery

## 2020-03-17 NOTE — Patient Instructions (Signed)
Your procedure is scheduled on: March 21, 2020 MONDAY Report to Day Surgery on the 2nd floor of the [facility]. To find out your arrival time, please call [phone] between 1PM - 3PM on: Friday March 18, 2020  REMEMBER: Instructions that are not followed completely may result in serious medical risk, up to and including death; or upon the discretion of your surgeon and anesthesiologist your surgery may need to be rescheduled.  Do not eat food after midnight the night before surgery.  No gum chewing, lozengers or hard candies.  You may however, drink CLEAR liquids up to 2 hours before you are scheduled to arrive for your surgery. Do not drink anything within 2 hours of your scheduled arrival time.  Clear liquids include: - water  - apple juice without pulp - gatorade (not RED) - black coffee or tea (Do NOT add milk or creamers to the coffee or tea) Do NOT drink anything that is not on this list.  Type 1 and Type 2 diabetics should only drink water.  TAKE THESE MEDICATIONS THE MORNING OF SURGERY WITH A SIP OF WATER: NONE  One week prior to surgery: Stop Anti-inflammatories (NSAIDS) such as Advil, Aleve, Ibuprofen, Motrin, Naproxen, Naprosyn and Aspirin based products such as Excedrin, Goodys Powder, BC Powder. STOP VOLTEREN Stop ANY OVER THE COUNTER supplements until after surgery. (You may continue taking Tylenol, Vitamin D, Vitamin B, and multivitamin.)  No Alcohol for 24 hours before or after surgery.  No Smoking including e-cigarettes for 24 hours prior to surgery.  No chewable tobacco products for at least 6 hours prior to surgery.  No nicotine patches on the day of surgery.  Do not use any "recreational" drugs for at least a week prior to your surgery.  Please be advised that the combination of cocaine and anesthesia may have negative outcomes, up to and including death. If you test positive for cocaine, your surgery will be cancelled.  On the morning of  surgery brush your teeth with toothpaste and water, you may rinse your mouth with mouthwash if you wish. Do not swallow any toothpaste or mouthwash.  Do not wear jewelry, make-up, hairpins, clips or nail polish.  Do not wear lotions, powders, or perfumes.   Do not shave 48 hours prior to surgery.   Contact lenses, hearing aids and dentures may not be worn into surgery.  Do not bring valuables to the hospital. [facility] is not responsible for any missing/lost belongings or valuables.   Use CHG Soap as directed on instruction sheet.  Notify your doctor if there is any change in your medical condition (cold, fever, infection).  Wear comfortable clothing (specific to your surgery type) to the hospital.  Plan for stool softeners for home use; pain medications have a tendency to cause constipation. You can also help prevent constipation by eating foods high in fiber such as fruits and vegetables and drinking plenty of fluids as your diet allows.  After surgery, you can help prevent lung complications by doing breathing exercises.  Take deep breaths and cough every 1-2 hours. Your doctor may order a device called an Incentive Spirometer to help you take deep breaths. When coughing or sneezing, hold a pillow firmly against your incision with both hands. This is called splinting. Doing this helps protect your incision. It also decreases belly discomfort.  If you are being discharged the day of surgery, you will not be allowed to drive home. You will need a responsible adult (18 years or  older) to drive you home and stay with you that night.   If you are taking public transportation, you will need to have a responsible adult (18 years or older) with you.  Please confirm with your physician that it is acceptable to use public transportation.   Please call the [facility] at [phone] if you have any questions about these instructions.  Visitation Policy:  Patients  undergoing a surgery or procedure may have one family member or support person with them as long as that person is not COVID-19 positive or experiencing its symptoms.  That person may remain in the waiting area during the procedure.  Inpatient Visitation Update:   In an effort to ensure the safety of our team members and our patients, we are implementing a change to our visitation policy:  Effective Monday, Aug. 9, at 7 a.m., inpatients will be allowed one support person.  o The support person may change daily.  o The support person must pass our screening, gel in and out, and wear a mask at all times, including in the patients room.  o Patients must also wear a mask when staff or their support person are in the room.  o Masking is required regardless of vaccination status.  Systemwide, no visitors 17 or younger.

## 2020-03-18 ENCOUNTER — Other Ambulatory Visit
Admission: RE | Admit: 2020-03-18 | Discharge: 2020-03-18 | Disposition: A | Discharge status: Home / Self Care | Payer: BC Managed Care – PPO | Source: Ambulatory Visit | Attending: Orthopedic Surgery | Admitting: Orthopedic Surgery

## 2020-03-18 DIAGNOSIS — Z20822 Contact with and (suspected) exposure to covid-19: Secondary | ICD-10-CM | POA: Diagnosis not present

## 2020-03-18 DIAGNOSIS — Z01812 Encounter for preprocedural laboratory examination: Secondary | ICD-10-CM | POA: Insufficient documentation

## 2020-03-18 LAB — BASIC METABOLIC PANEL
Anion gap: 9 (ref 5–15)
BUN: 17 mg/dL (ref 8–23)
CO2: 29 mmol/L (ref 22–32)
Calcium: 9.6 mg/dL (ref 8.9–10.3)
Chloride: 101 mmol/L (ref 98–111)
Creatinine, Ser: 0.87 mg/dL (ref 0.44–1.00)
GFR calc Af Amer: >60 mL/min (ref >60)
GFR calc non Af Amer: >60 mL/min (ref >60)
Glucose, Bld: 69 mg/dL — ABNORMAL LOW (ref 70–99)
Potassium: 3.5 mmol/L (ref 3.5–5.1)
Sodium: 139 mmol/L (ref 135–145)

## 2020-03-18 LAB — URINALYSIS, ROUTINE W REFLEX MICROSCOPIC
Bilirubin Urine: NEGATIVE
Glucose, UA: NEGATIVE mg/dL
Hgb urine dipstick: NEGATIVE
Ketones, ur: NEGATIVE mg/dL
Leukocytes,Ua: NEGATIVE
Nitrite: NEGATIVE
Protein, ur: NEGATIVE mg/dL
Specific Gravity, Urine: 1.005 (ref 1.005–1.030)
pH: 6 (ref 5.0–8.0)

## 2020-03-18 LAB — CBC
HCT: 38.8 % (ref 36.0–46.0)
Hemoglobin: 13.1 g/dL (ref 12.0–15.0)
MCH: 31.4 pg (ref 26.0–34.0)
MCHC: 33.8 g/dL (ref 30.0–36.0)
MCV: 93 fL (ref 80.0–100.0)
Platelets: 213 10*3/uL (ref 150–400)
RBC: 4.17 MIL/uL (ref 3.87–5.11)
RDW: 12.2 % (ref 11.5–15.5)
WBC: 4.8 10*3/uL (ref 4.0–10.5)
nRBC: 0 % (ref 0.0–0.2)

## 2020-03-18 LAB — PROTIME-INR
INR: 1 (ref 0.8–1.2)
Prothrombin Time: 12.9 seconds (ref 11.4–15.2)

## 2020-03-18 LAB — APTT: aPTT: 28 seconds (ref 24–36)

## 2020-03-18 NOTE — Progress Notes (Signed)
  [facility] Perioperative Services: Pre-Admission/Anesthesia Testing   Date: 03/18/20  Name: Megan Rollins MRN:   [id number]  Re: Consideration of perioperative therapeutic ABX change in patient with PCN allergy  Request sent to: Lovell Sheehan, MD and Carlynn Spry, PA-C Notification mode: Routed and/or faxed via [employer]   Procedure: SHOULDER ARTHROSCOPY WITH ROTATOR CUFF REPAIR (Right ) Date of procedure: 03/21/2020  Notes: 1. Patient has a documented allergy to PCN.  2. Advising that PCN has caused her to experience a low severity rash in the past.  3. Screened as appropriate for cephalosporin use during medication reconciliation . No immediate angioedema, dysphagia, SOB, or anaphylaxis symptoms. . No severe rash involving mucous membranes or skin necrosis. . No hospital admissions related to side effects of PCN/cephalosporin use.  . No documented reaction to PCN or cephalosporin in the last 10 years.  Currently ordered preoperative prophylactic ABX: clindamycin.   Request: As an evidence based approach to reducing the rate of incidence for post-operative SSI and the development of MDROs, could an agent with narrower coverage for preoperative prophylaxis in this patient's upcoming surgical course be considered?  1. Specifically requesting change to cephalosporin (CEFAZOLIN). 2. Please have your staff communicate decision with me and I would be happy to change the orders in Epic as per your directives.   Things to consider: . Many patients report that they were "allergic" to PCN earlier in life, however this does not translate into a true lifelong allergy. Patients can lose sensitivity to specific IgE antibodies over time if PCN is avoided (Kleris & Lugar, 2019).  Marland Kitchen Up to 10% of the adult population and 15% of hospitalized patients report an allergy to PCN, however clinical studies suggest that 90% of those reporting an allergy can tolerate PCN antibiotics  (Kleris & Lugar, 2019).  . Cross-sensitivity between PCN and cephalosporins has been documented as being as high as 10%, however this estimation included data believed to have been collected in a setting where there was contamination. Newer data suggests that the prevalence of cross-sensitivity between PCN and cephalosporins is actually estimated to be closer to 1% (Hermanides et al., 2018).   . Patients labeled as PCN allergic, whether they are truly allergic or not, have been found to have inferior outcomes in terms of rates of serious infection, and these patients tend to have longer hospital stays ([place], 2019).  . Treatment related secondary infections, such as Clostridioides difficile, have been linked to the improper use of broad spectrum antibiotics in patients improperly labeled as PCN allergic (Kleris & Lugar, 2019).  Marland Kitchen Anaphylaxis from cephalosporins is rare and the evidence suggests that there is no increased risk of an anaphylactic type reaction when cephalosporins are used in a PCN allergic patient (Pichichero, 2006).   Citations Hermanides J, Lemkes BA, Prins JM, Hollmann MW, Terreehorst I. Presumed ?-Lactam Allergy and Cross-reactivity in the Operating Theater: A Practical Approach. Anesthesiology. 2018 Aug;129(2):335-342. doi: 10.1097/ALN.0000000000002252. PMID: 62376283.  Kleris, [place]., & Lugar, P. L. (2019). Things We Do For No Reason: Failing to Question a Penicillin Allergy History. Journal of hospital medicine, 14(10), [phone]. Advance online publication. https://www.wallace-middleton.info/  Pichichero, M. E. (2006). Cephalosporins can be prescribed safely for penicillin-allergic patients. Journal of family medicine, 55(2), 106-112. Accessed: https://cdn.mdedge.com/files/s82fs-public/Document/September-2017/5502JFP_AppliedEvidence1.pdf  Honor Loh, MSN, APRN, FNP-C, CEN [facility]  Peri-operative Services Nurse Practitioner Phone: [phone] 03/18/20 8:26 AM

## 2020-03-19 LAB — SARS CORONAVIRUS 2 (TAT 6-24 HRS): SARS Coronavirus 2: NEGATIVE

## 2020-03-21 ENCOUNTER — Encounter: Payer: Self-pay | Admitting: Orthopedic Surgery

## 2020-03-21 ENCOUNTER — Ambulatory Visit: Payer: BC Managed Care – PPO | Admitting: Urgent Care

## 2020-03-21 ENCOUNTER — Encounter
Admission: RE | Disposition: A | Discharge status: Home / Self Care | Payer: Self-pay | Source: Home / Self Care | Attending: Orthopedic Surgery

## 2020-03-21 ENCOUNTER — Other Ambulatory Visit: Payer: Self-pay

## 2020-03-21 ENCOUNTER — Ambulatory Visit: Payer: BC Managed Care – PPO | Admitting: Certified Registered"

## 2020-03-21 ENCOUNTER — Ambulatory Visit: Payer: BC Managed Care – PPO

## 2020-03-21 ENCOUNTER — Ambulatory Visit
Admission: RE | Admit: 2020-03-21 | Discharge: 2020-03-21 | Disposition: A | Discharge status: Home / Self Care | Payer: BC Managed Care – PPO | Attending: Orthopedic Surgery | Admitting: Orthopedic Surgery

## 2020-03-21 DIAGNOSIS — Z811 Family history of alcohol abuse and dependence: Secondary | ICD-10-CM | POA: Insufficient documentation

## 2020-03-21 DIAGNOSIS — Z8249 Family history of ischemic heart disease and other diseases of the circulatory system: Secondary | ICD-10-CM | POA: Insufficient documentation

## 2020-03-21 DIAGNOSIS — Z8379 Family history of other diseases of the digestive system: Secondary | ICD-10-CM | POA: Diagnosis not present

## 2020-03-21 DIAGNOSIS — F329 Major depressive disorder, single episode, unspecified: Secondary | ICD-10-CM | POA: Diagnosis not present

## 2020-03-21 DIAGNOSIS — Z90710 Acquired absence of both cervix and uterus: Secondary | ICD-10-CM | POA: Insufficient documentation

## 2020-03-21 DIAGNOSIS — Z9221 Personal history of antineoplastic chemotherapy: Secondary | ICD-10-CM | POA: Diagnosis not present

## 2020-03-21 DIAGNOSIS — Z880 Allergy status to penicillin: Secondary | ICD-10-CM | POA: Insufficient documentation

## 2020-03-21 DIAGNOSIS — M1990 Unspecified osteoarthritis, unspecified site: Secondary | ICD-10-CM | POA: Diagnosis not present

## 2020-03-21 DIAGNOSIS — Z881 Allergy status to other antibiotic agents status: Secondary | ICD-10-CM | POA: Diagnosis not present

## 2020-03-21 DIAGNOSIS — Z923 Personal history of irradiation: Secondary | ICD-10-CM | POA: Insufficient documentation

## 2020-03-21 DIAGNOSIS — G43909 Migraine, unspecified, not intractable, without status migrainosus: Secondary | ICD-10-CM | POA: Insufficient documentation

## 2020-03-21 DIAGNOSIS — Z808 Family history of malignant neoplasm of other organs or systems: Secondary | ICD-10-CM | POA: Insufficient documentation

## 2020-03-21 DIAGNOSIS — Z803 Family history of malignant neoplasm of breast: Secondary | ICD-10-CM | POA: Diagnosis not present

## 2020-03-21 DIAGNOSIS — Z853 Personal history of malignant neoplasm of breast: Secondary | ICD-10-CM | POA: Diagnosis not present

## 2020-03-21 DIAGNOSIS — Z882 Allergy status to sulfonamides status: Secondary | ICD-10-CM | POA: Diagnosis not present

## 2020-03-21 DIAGNOSIS — M75121 Complete rotator cuff tear or rupture of right shoulder, not specified as traumatic: Secondary | ICD-10-CM | POA: Diagnosis not present

## 2020-03-21 DIAGNOSIS — Z419 Encounter for procedure for purposes other than remedying health state, unspecified: Secondary | ICD-10-CM

## 2020-03-21 DIAGNOSIS — Z90721 Acquired absence of ovaries, unilateral: Secondary | ICD-10-CM | POA: Insufficient documentation

## 2020-03-21 HISTORY — PX: SHOULDER ARTHROSCOPY WITH ROTATOR CUFF REPAIR: SHX5685

## 2020-03-21 SURGERY — ARTHROSCOPY, SHOULDER, WITH ROTATOR CUFF REPAIR
Anesthesia: General | Site: Shoulder | Laterality: Right

## 2020-03-21 MED ORDER — ONDANSETRON HCL 4 MG PO TABS: 4.0000 mg | ORAL_TABLET | Freq: Four times a day (QID) | ORAL | Status: DC | PRN

## 2020-03-21 MED ORDER — CLINDAMYCIN PHOSPHATE 900 MG/50ML IV SOLN
INTRAVENOUS | Status: AC
  Filled 2020-03-21: qty 50

## 2020-03-21 MED ORDER — OXYCODONE-ACETAMINOPHEN 5-325 MG PO TABS: 1.0000 | ORAL_TABLET | ORAL | 0 refills | Status: AC | PRN

## 2020-03-21 MED ORDER — MIDAZOLAM HCL 2 MG/2ML IJ SOLN
INTRAMUSCULAR | Status: AC
  Administered 2020-03-21: 1 mg via INTRAVENOUS
  Filled 2020-03-21: qty 2

## 2020-03-21 MED ORDER — ROCURONIUM BROMIDE 10 MG/ML (PF) SYRINGE
PREFILLED_SYRINGE | INTRAVENOUS | Status: AC
  Filled 2020-03-21: qty 10

## 2020-03-21 MED ORDER — OXYCODONE HCL 5 MG PO TABS
ORAL_TABLET | ORAL | Status: DC
Start: 2020-03-21 — End: 2020-03-21
  Filled 2020-03-21: qty 1

## 2020-03-21 MED ORDER — OXYCODONE HCL 5 MG PO TABS: 5.0000 mg | ORAL_TABLET | ORAL | Status: DC | PRN

## 2020-03-21 MED ORDER — DEXAMETHASONE SODIUM PHOSPHATE 10 MG/ML IJ SOLN
INTRAMUSCULAR | Status: DC | PRN
  Administered 2020-03-21: 10 mg via INTRAVENOUS

## 2020-03-21 MED ORDER — CHLORHEXIDINE GLUCONATE 0.12 % MT SOLN: 15.0000 mL | Freq: Once | OROMUCOSAL | Status: AC

## 2020-03-21 MED ORDER — ACETAMINOPHEN 325 MG PO TABS: 325.0000 mg | ORAL_TABLET | Freq: Four times a day (QID) | ORAL | Status: DC | PRN

## 2020-03-21 MED ORDER — SUGAMMADEX SODIUM 200 MG/2ML IV SOLN
INTRAVENOUS | Status: DC | PRN
  Administered 2020-03-21: 200 mg via INTRAVENOUS

## 2020-03-21 MED ORDER — LIDOCAINE HCL (CARDIAC) PF 100 MG/5ML IV SOSY
PREFILLED_SYRINGE | INTRAVENOUS | Status: DC | PRN
  Administered 2020-03-21: 100 mg via INTRAVENOUS

## 2020-03-21 MED ORDER — MIDAZOLAM HCL 2 MG/2ML IJ SOLN
INTRAMUSCULAR | Status: AC
  Filled 2020-03-21: qty 2

## 2020-03-21 MED ORDER — BUPIVACAINE HCL (PF) 0.5 % IJ SOLN
INTRAMUSCULAR | Status: AC
  Filled 2020-03-21: qty 30

## 2020-03-21 MED ORDER — LIDOCAINE HCL (PF) 2 % IJ SOLN
INTRAMUSCULAR | Status: AC
  Filled 2020-03-21: qty 10

## 2020-03-21 MED ORDER — FAMOTIDINE 20 MG PO TABS: 20.0000 mg | ORAL_TABLET | Freq: Once | ORAL | Status: AC

## 2020-03-21 MED ORDER — CHLORHEXIDINE GLUCONATE 0.12 % MT SOLN
OROMUCOSAL | Status: AC
  Administered 2020-03-21: 15 mL via OROMUCOSAL
  Filled 2020-03-21: qty 15

## 2020-03-21 MED ORDER — KETOROLAC TROMETHAMINE 15 MG/ML IJ SOLN: 15.0000 mg | Freq: Four times a day (QID) | INTRAMUSCULAR | Status: DC

## 2020-03-21 MED ORDER — EPINEPHRINE (ANAPHYLAXIS) 30 MG/30ML IJ SOLN
INTRAMUSCULAR | Status: DC | PRN
  Administered 2020-03-21: 4 mL

## 2020-03-21 MED ORDER — ONDANSETRON HCL 4 MG/2ML IJ SOLN
INTRAMUSCULAR | Status: DC | PRN
  Administered 2020-03-21: 4 mg via INTRAVENOUS

## 2020-03-21 MED ORDER — OXYCODONE HCL 5 MG PO TABS
5.0000 mg | ORAL_TABLET | Freq: Once | ORAL | Status: AC | PRN
  Administered 2020-03-21: 5 mg via ORAL

## 2020-03-21 MED ORDER — LIDOCAINE HCL (PF) 1 % IJ SOLN
INTRAMUSCULAR | Status: AC
  Filled 2020-03-21: qty 5

## 2020-03-21 MED ORDER — LACTATED RINGERS IV SOLN: INTRAVENOUS | Status: DC

## 2020-03-21 MED ORDER — HYDROMORPHONE HCL 1 MG/ML IJ SOLN: 0.5000 mg | INTRAMUSCULAR | Status: DC | PRN

## 2020-03-21 MED ORDER — FENTANYL CITRATE (PF) 100 MCG/2ML IJ SOLN
INTRAMUSCULAR | Status: DC | PRN
Start: 2020-03-21 — End: 2020-03-21
  Administered 2020-03-21: 50 ug via INTRAVENOUS
  Administered 2020-03-21 (×2): 25 ug via INTRAVENOUS

## 2020-03-21 MED ORDER — FENTANYL CITRATE (PF) 100 MCG/2ML IJ SOLN
INTRAMUSCULAR | Status: AC
  Administered 2020-03-21: 50 ug via INTRAVENOUS
  Filled 2020-03-21: qty 2

## 2020-03-21 MED ORDER — FENTANYL CITRATE (PF) 100 MCG/2ML IJ SOLN
25.0000 ug | INTRAMUSCULAR | Status: DC | PRN
  Administered 2020-03-21: 50 ug via INTRAVENOUS

## 2020-03-21 MED ORDER — FAMOTIDINE 20 MG PO TABS
ORAL_TABLET | ORAL | Status: AC
  Administered 2020-03-21: 20 mg via ORAL
  Filled 2020-03-21: qty 1

## 2020-03-21 MED ORDER — CLINDAMYCIN PHOSPHATE 900 MG/50ML IV SOLN
900.0000 mg | INTRAVENOUS | Status: AC
  Administered 2020-03-21: 900 mg via INTRAVENOUS

## 2020-03-21 MED ORDER — BUPIVACAINE LIPOSOME 1.3 % IJ SUSP
INTRAMUSCULAR | Status: AC
  Filled 2020-03-21: qty 20

## 2020-03-21 MED ORDER — EPHEDRINE SULFATE 50 MG/ML IJ SOLN
INTRAMUSCULAR | Status: DC | PRN
  Administered 2020-03-21: 10 mg via INTRAVENOUS
  Administered 2020-03-21: 5 mg via INTRAVENOUS
  Administered 2020-03-21 (×2): 10 mg via INTRAVENOUS
  Administered 2020-03-21: 5 mg via INTRAVENOUS
  Administered 2020-03-21: 10 mg via INTRAVENOUS
  Administered 2020-03-21: 5 mg via INTRAVENOUS

## 2020-03-21 MED ORDER — ACETAMINOPHEN 10 MG/ML IV SOLN
INTRAVENOUS | Status: DC | PRN
  Administered 2020-03-21: 1000 mg via INTRAVENOUS

## 2020-03-21 MED ORDER — OXYCODONE HCL 5 MG/5ML PO SOLN: 5.0000 mg | Freq: Once | ORAL | Status: AC | PRN

## 2020-03-21 MED ORDER — MIDAZOLAM HCL 2 MG/2ML IJ SOLN: 1.0000 mg | Freq: Once | INTRAMUSCULAR | Status: AC

## 2020-03-21 MED ORDER — FENTANYL CITRATE (PF) 100 MCG/2ML IJ SOLN
INTRAMUSCULAR | Status: AC
  Administered 2020-03-21: 25 ug via INTRAVENOUS
  Filled 2020-03-21: qty 2

## 2020-03-21 MED ORDER — SODIUM CHLORIDE FLUSH 0.9 % IV SOLN
INTRAVENOUS | Status: AC
  Filled 2020-03-21: qty 20

## 2020-03-21 MED ORDER — PROPOFOL 10 MG/ML IV BOLUS
INTRAVENOUS | Status: AC
  Filled 2020-03-21: qty 20

## 2020-03-21 MED ORDER — METOCLOPRAMIDE HCL 10 MG PO TABS: 5.0000 mg | ORAL_TABLET | Freq: Three times a day (TID) | ORAL | Status: DC | PRN

## 2020-03-21 MED ORDER — ORAL CARE MOUTH RINSE: 15.0000 mL | Freq: Once | OROMUCOSAL | Status: AC

## 2020-03-21 MED ORDER — METOCLOPRAMIDE HCL 5 MG/ML IJ SOLN: 5.0000 mg | Freq: Three times a day (TID) | INTRAMUSCULAR | Status: DC | PRN

## 2020-03-21 MED ORDER — SUCCINYLCHOLINE CHLORIDE 200 MG/10ML IV SOSY
PREFILLED_SYRINGE | INTRAVENOUS | Status: AC
  Filled 2020-03-21: qty 10

## 2020-03-21 MED ORDER — ONDANSETRON HCL 4 MG/2ML IJ SOLN: 4.0000 mg | Freq: Once | INTRAMUSCULAR | Status: DC | PRN

## 2020-03-21 MED ORDER — ONDANSETRON HCL 4 MG/2ML IJ SOLN: 4.0000 mg | Freq: Four times a day (QID) | INTRAMUSCULAR | Status: DC | PRN

## 2020-03-21 MED ORDER — PROPOFOL 10 MG/ML IV BOLUS
INTRAVENOUS | Status: DC | PRN
  Administered 2020-03-21: 200 mg via INTRAVENOUS

## 2020-03-21 MED ORDER — BUPIVACAINE HCL (PF) 0.5 % IJ SOLN
INTRAMUSCULAR | Status: AC
  Filled 2020-03-21: qty 10

## 2020-03-21 MED ORDER — ROCURONIUM BROMIDE 100 MG/10ML IV SOLN
INTRAVENOUS | Status: DC | PRN
  Administered 2020-03-21: 50 mg via INTRAVENOUS

## 2020-03-21 MED ORDER — FENTANYL CITRATE (PF) 100 MCG/2ML IJ SOLN: 50.0000 ug | Freq: Once | INTRAMUSCULAR | Status: AC

## 2020-03-21 MED ORDER — ACETAMINOPHEN 10 MG/ML IV SOLN
INTRAVENOUS | Status: AC
  Filled 2020-03-21: qty 100

## 2020-03-21 MED ORDER — OXYCODONE HCL 5 MG PO TABS: 10.0000 mg | ORAL_TABLET | ORAL | Status: DC | PRN

## 2020-03-21 MED ORDER — EPINEPHRINE PF 1 MG/ML IJ SOLN
INTRAMUSCULAR | Status: AC
  Filled 2020-03-21: qty 4

## 2020-03-21 MED ORDER — FENTANYL CITRATE (PF) 100 MCG/2ML IJ SOLN
INTRAMUSCULAR | Status: AC
  Filled 2020-03-21: qty 2

## 2020-03-21 MED ORDER — ROPIVACAINE HCL 5 MG/ML IJ SOLN
INTRAMUSCULAR | Status: AC
  Filled 2020-03-21: qty 20

## 2020-03-21 SURGICAL SUPPLY — 74 items
ADAPTER IRRIG TUBE 2 SPIKE SOL (ADAPTER) ×6 IMPLANT
ADPR TBG 2 SPK PMP STRL ASCP (ADAPTER) ×2
ANCH SUT 2 4.75 2 THRD PEEK (Anchor) ×2 IMPLANT
ANCH SUT 2 TPE SLF PNCH BLK (Anchor) ×1 IMPLANT
ANCH SUT 2 TPE SLF PNCH BLU (Anchor) ×1 IMPLANT
ANCHOR SUT CROSSFT 4.75 (Anchor) ×4 IMPLANT
ANCHOR YKNOT PRO RC BLUE TAPE (Anchor) ×2 IMPLANT
ANCHOR YKNOT PRO RC HI-FI TAPE (Anchor) ×2 IMPLANT
APL PRP STRL LF DISP 70% ISPRP (MISCELLANEOUS) ×1
BLADE FULL RADIUS 3.5 (BLADE) ×3 IMPLANT
BLADE INCISOR PLUS 4.5 (BLADE) ×3 IMPLANT
BLADE SURG MINI STRL (BLADE) ×3 IMPLANT
BRUSH SCRUB EZ  4% CHG (MISCELLANEOUS) ×3
BRUSH SCRUB EZ 4% CHG (MISCELLANEOUS) ×1 IMPLANT
BUR ACROMIONIZER 4.0 (BURR) ×2 IMPLANT
BUR BR 5.5 WIDE MOUTH (BURR) IMPLANT
CANNULA SHOULDER 7CM (CANNULA) ×3 IMPLANT
CANNULA TWIST IN 8.25X7CM (CANNULA) ×3 IMPLANT
CHLORAPREP W/TINT 26 (MISCELLANEOUS) ×3 IMPLANT
COOLER POLAR GLACIER W/PUMP (MISCELLANEOUS) ×3 IMPLANT
COVER WAND RF STERILE (DRAPES) ×3 IMPLANT
DEVICE SUCT BLK HOLE OR FLOOR (MISCELLANEOUS) ×3 IMPLANT
DRAPE 3/4 80X56 (DRAPES) ×3 IMPLANT
DRAPE STERI 35X30 U-POUCH (DRAPES) ×5 IMPLANT
DRAPE U-SHAPE 47X51 STRL (DRAPES) ×3 IMPLANT
ELECT REM PT RETURN 9FT ADLT (ELECTROSURGICAL)
ELECTRODE REM PT RTRN 9FT ADLT (ELECTROSURGICAL) IMPLANT
GAUZE 4X4 16PLY RFD (DISPOSABLE) IMPLANT
GAUZE SPONGE 4X4 12PLY STRL (GAUZE/BANDAGES/DRESSINGS) ×3 IMPLANT
GAUZE XEROFORM 1X8 LF (GAUZE/BANDAGES/DRESSINGS) ×3 IMPLANT
GLOVE BIOGEL PI IND STRL 8 (GLOVE) ×1 IMPLANT
GLOVE BIOGEL PI INDICATOR 8 (GLOVE) ×2
GLOVE SURG ORTHO 8.0 STRL STRW (GLOVE) ×3 IMPLANT
GOWN STRL REUS W/ TWL LRG LVL3 (GOWN DISPOSABLE) ×1 IMPLANT
GOWN STRL REUS W/ TWL XL LVL3 (GOWN DISPOSABLE) ×1 IMPLANT
GOWN STRL REUS W/TWL LRG LVL3 (GOWN DISPOSABLE) ×3
GOWN STRL REUS W/TWL XL LVL3 (GOWN DISPOSABLE) ×3
IV LACTATED RINGER IRRG 3000ML (IV SOLUTION) ×18
IV LR IRRIG 3000ML ARTHROMATIC (IV SOLUTION) ×6 IMPLANT
KIT STABILIZATION SHOULDER (MISCELLANEOUS) ×3 IMPLANT
KIT TURNOVER KIT A (KITS) ×3 IMPLANT
MANIFOLD NEPTUNE II (INSTRUMENTS) ×6 IMPLANT
MASK FACE SPIDER DISP (MASK) ×3 IMPLANT
MAT ABSORB  FLUID 56X50 GRAY (MISCELLANEOUS) ×3
MAT ABSORB FLUID 56X50 GRAY (MISCELLANEOUS) ×1 IMPLANT
NDL SAFETY ECLIPSE 18X1.5 (NEEDLE) ×1 IMPLANT
NDL SCORPION MULTI FIRE (NEEDLE) IMPLANT
NDL SPNL 18GX3.5 QUINCKE PK (NEEDLE) ×1 IMPLANT
NEEDLE HYPO 18GX1.5 SHARP (NEEDLE) ×3
NEEDLE HYPO 22GX1.5 SAFETY (NEEDLE) ×3 IMPLANT
NEEDLE SCORPION MULTI FIRE (NEEDLE) ×3 IMPLANT
NEEDLE SPNL 18GX3.5 QUINCKE PK (NEEDLE) ×3 IMPLANT
PACK SHDR ARTHRO (MISCELLANEOUS) ×3 IMPLANT
PAD ABD DERMACEA PRESS 5X9 (GAUZE/BANDAGES/DRESSINGS) IMPLANT
PAD ARMBOARD 7.5X6 YLW CONV (MISCELLANEOUS) ×3 IMPLANT
PAD WRAPON POLAR SHDR XLG (MISCELLANEOUS) ×1 IMPLANT
SHEATH SHORT HANDLE 4.0 (SHEATH) ×2 IMPLANT
SLING ARM LRG DEEP (SOFTGOODS) IMPLANT
SLING ULTRA II M (MISCELLANEOUS) ×2 IMPLANT
STRAP SAFETY 5IN WIDE (MISCELLANEOUS) ×3 IMPLANT
SUT ETHILON NAB PS2 4-0 18IN (SUTURE) ×5 IMPLANT
SUT FIBERWIRE #2 38 T-5 BLUE (SUTURE)
SUT PDS AB 0 CT1 27 (SUTURE) ×3 IMPLANT
SUT PROLENE 0 CT 2 (SUTURE) ×3 IMPLANT
SUT TIGER TAPE 7 IN WHITE (SUTURE) IMPLANT
SUTURE FIBERWR #2 38 T-5 BLUE (SUTURE) IMPLANT
SYR 10ML LL (SYRINGE) ×3 IMPLANT
SYR 50ML LL SCALE MARK (SYRINGE) ×3 IMPLANT
TAPE MICROFOAM 4IN (TAPE) ×3 IMPLANT
TUBING ARTHRO INFLOW-ONLY STRL (TUBING) ×3 IMPLANT
TUBING CONNECTING 10 (TUBING) ×2 IMPLANT
TUBING CONNECTING 10' (TUBING) ×1
WAND WEREWOLF FLOW 90D (MISCELLANEOUS) ×3 IMPLANT
WRAPON POLAR PAD SHDR XLG (MISCELLANEOUS) ×3

## 2020-03-21 NOTE — Anesthesia Preprocedure Evaluation (Addendum)
Anesthesia Evaluation  Patient identified by MRN, date of birth, ID band Patient awake    Reviewed: Allergy & Precautions, H&P , NPO status , Patient's Chart, lab work & pertinent test results  History of Anesthesia Complications Negative for: history of anesthetic complications  Airway Mallampati: II  TM Distance: >3 FB     Dental  (+) Teeth Intact   Pulmonary neg pulmonary ROS, neg sleep apnea, neg COPD,    breath sounds clear to auscultation       Cardiovascular (-) angina(-) Past MI and (-) Cardiac Stents negative cardio ROS  (-) dysrhythmias  Rhythm:regular Rate:Normal     Neuro/Psych  Headaches, Depression    GI/Hepatic negative GI ROS, Neg liver ROS,   Endo/Other  negative endocrine ROS  Renal/GU      Musculoskeletal   Abdominal   Peds  Hematology negative hematology ROS (+)   Anesthesia Other Findings Past Medical History: No date: Arthritis     Comment:  shoulder 2001: Cancer ([place])     Comment:  left breast No date: Cancer ([place]) No date: Depression No date: Hx of migraines  Past Surgical History: 2005: ABDOMINAL HYSTERECTOMY     Comment:  total No date: APPENDECTOMY No date: BREAST LUMPECTOMY; Left     Comment:  chemo and radiation 2001 2001: BREAST SURGERY; Left     Comment:  lumpectomy with radiation therapy and chemo therapy No date: CATARACT EXTRACTION, BILATERAL 1984: CESAREAN SECTION No date: KIDNEY SURGERY; Right     Comment:  as a child 2005: OOPHORECTOMY; Bilateral 06/18/2019: SHOULDER ARTHROSCOPY WITH OPEN ROTATOR CUFF REPAIR; Right     Comment:  Procedure: SHOULDER ARTHROSCOPIC ROTATOR CUFF REPAIR,               DISTAL CLAVICLE EXCISION, SUBACHROMIAL DECOMPRESSION,               INTRA-ARTICULAR DEBRIDEMENT;  Surgeon: Lovell Sheehan,               MD;  Location: [facility];  Service:               Orthopedics;  Laterality: Right; 2008: SHOULDER SURGERY 2014: TIBIA  FRACTURE SURGERY     Comment:  rod placed     Reproductive/Obstetrics negative OB ROS                            Anesthesia Physical Anesthesia Plan  ASA: II  Anesthesia Plan: General ETT   Post-op Pain Management: GA combined w/ Regional for post-op pain   Induction:   PONV Risk Score and Plan: Ondansetron, Dexamethasone, Midazolam and Treatment may vary due to age or medical condition  Airway Management Planned: Oral ETT  Additional Equipment:   Intra-op Plan:   Post-operative Plan:   Informed Consent: I have reviewed the patients History and Physical, chart, labs and discussed the procedure including the risks, benefits and alternatives for the proposed anesthesia with the patient or authorized representative who has indicated his/her understanding and acceptance.     Dental Advisory Given  Plan Discussed with: Anesthesiologist, CRNA and Surgeon  Anesthesia Plan Comments:        Anesthesia Quick Evaluation

## 2020-03-21 NOTE — Anesthesia Procedure Notes (Signed)
Procedure Name: Intubation Date/Time: 03/21/2020 12:19 PM Performed by: Hedda Slade, CRNA Pre-anesthesia Checklist: Patient identified, Patient being monitored, Timeout performed, Emergency Drugs available and Suction available Patient Re-evaluated:Patient Re-evaluated prior to induction Oxygen Delivery Method: Circle system utilized Preoxygenation: Pre-oxygenation with 100% oxygen Induction Type: IV induction Ventilation: Mask ventilation without difficulty Laryngoscope Size: Mac and 3 Grade View: Grade I Tube type: Oral Tube size: 7.0 mm Number of attempts: 1 Airway Equipment and Method: Stylet Placement Confirmation: ETT inserted through vocal cords under direct vision,  positive ETCO2 and breath sounds checked- equal and bilateral Secured at: 21 cm Tube secured with: Tape Dental Injury: Teeth and Oropharynx as per pre-operative assessment

## 2020-03-21 NOTE — Anesthesia Procedure Notes (Signed)
Anesthesia Regional Block: Interscalene brachial plexus block   Pre-Anesthetic Checklist: ,, timeout performed, Correct Patient, Correct Site, Correct Laterality, Correct Procedure, Correct Position, site marked, Risks and benefits discussed,  Surgical consent,  Pre-op evaluation,  At surgeon's request and post-op pain management  Laterality: Right  Prep: chloraprep       Needles:  Injection technique: Single-shot  Needle Type: Stimiplex     Needle Length: 9cm  Needle Gauge: 21     Additional Needles:   Procedures:,,,, ultrasound used (permanent image in chart),,,,  Narrative:  Start time: 03/21/2020 11:41 AM End time: 03/21/2020 11:47 AM  Performed by: Personally  Anesthesiologist: Tera Mater, MD  Additional Notes: Risks and benefits of nerve block discussed with patient, including but not limited to risk of nerve injury, bleeding, infection, and failed block.  Patient expressed understanding and consented to block placement.   Functioning IV was confirmed and monitors were applied.  Sterile prep,hand hygiene and sterile gloves were used.  Minimal sedation used for procedure.  During the procedure, there was negative aspiration, negative paresthesia on injection, and dose was given in divided aliquots under ultrasound guidance.  Patient tolerated the procedure well with no immediate complications.

## 2020-03-21 NOTE — H&P (Signed)
The patient has been re-examined, and the chart reviewed, and there have been no interval changes to the documented history and physical.  Plan a right shoulder arthroscopy today.  Anesthesia is consulted regarding a peripheral nerve block for post-operative pain.  The risks, benefits, and alternatives have been discussed at length, and the patient is willing to proceed.    

## 2020-03-21 NOTE — Transfer of Care (Signed)
Immediate Anesthesia Transfer of Care Note  Patient: Megan Rollins  Procedure(s) Performed: SHOULDER ARTHROSCOPY WITH ROTATOR CUFF REPAIR (Right Shoulder)  Patient Location: PACU  Anesthesia Type:General  Level of Consciousness: sedated  Airway & Oxygen Therapy: Patient Spontanous Breathing and Patient connected to face mask oxygen  Post-op Assessment: Report given to RN and Post -op Vital signs reviewed and stable  Post vital signs: Reviewed and stable  Last Vitals:  Vitals Value Taken Time  BP 133/59 03/21/20 1425  Temp    Pulse 58 03/21/20 1426  Resp 9 03/21/20 1426  SpO2 98 % 03/21/20 1426  Vitals shown include unvalidated device data.  Last Pain:  Vitals:   03/21/20 1425  TempSrc:   PainSc: 0-No pain         Complications: No complications documented.

## 2020-03-21 NOTE — Discharge Instructions (Signed)
Wear sling at all times, including sleep.  You will need to use the sling for a total of 4 weeks following surgery.  Do not try and lift your arm up or away from your body for any reason.   Keep the dressing dry.  You may remove bandage in 3 days.  You may place Band-Aids over top of the incisions.  May shower once dressing is removed in 3 days.  Remove sling carefully only for showers, leaving arm down by your side while in the shower.  +++ Make sure to take some pain medication this evening before you fall asleep, in preparation for the nerve block wearing off in the middle of the night.  If the the pain medication causes itching, or is too strong, try taking a single tablet at a time, or combining with Benadryl.  You may be most comfortable sleeping in a recliner.  If you do sleep in near bed, placed pillows behind the shoulder that have the operation to support it.      Interscalene Nerve Block with Exparel  1.  For your surgery you have received an Interscalene Nerve Block with Exparel. 2. Nerve Blocks affect many types of nerves, including nerves that control movement, pain and normal sensation.  You may experience feelings such as numbness, tingling, heaviness, weakness or the inability to move your arm or the feeling or sensation that your arm has "fallen asleep". 3. A nerve block with Exparel can last up to 5 days.  Usually the weakness wears off first.  The tingling and heaviness usually wear off next.  Finally you may start to notice pain.  Keep in mind that this may occur in any order.  Once a nerve block starts to wear off it is usually completely gone within 60 minutes. 4. ISNB may cause mild shortness of breath, a hoarse voice, blurry vision, unequal pupils, or drooping of the face on the same side as the nerve block.  These symptoms will usually resolve with the numbness.  Very rarely the procedure itself can cause mild seizures. 5. If needed, your surgeon will give you a  prescription for pain medication.  It will take about 60 minutes for the oral pain medication to become fully effective.  So, it is recommended that you start taking this medication before the nerve block first begins to wear off, or when you first begin to feel discomfort. 6. Take your pain medication only as prescribed.  Pain medication can cause sedation and decrease your breathing if you take more than you need for the level of pain that you have. 7. Nausea is a common side effect of many pain medications.  You may want to eat something before taking your pain medicine to prevent nausea. 8. After an Interscalene nerve block, you cannot feel pain, pressure or extremes in temperature in the effected arm.  Because your arm is numb it is at an increased risk for injury.  To decrease the possibility of injury, please practice the following:  a. While you are awake change the position of your arm frequently to prevent too much pressure on any one area for prolonged periods of time. b.  If you have a cast or tight dressing, check the color or your fingers every couple of hours.  Call your surgeon with the appearance of any discoloration (white or blue). c. If you are given a sling to wear before you go home, please wear it  at all times until the block   has completely worn off.  Do not get up at night without your sling. d. Please contact [facility] or your surgeon if you do not begin to regain sensation after 7 days from the surgery.  Anesthesia may be contacted by calling the Same Day Surgery Department, Mon. through Fri., 6 am to 4 pm at [phone].   e. If you experience any other problems or concerns, please contact your surgeon's office. f. If you experience severe or prolonged shortness of breath go to the nearest emergency department.  AMBULATORY SURGERY  DISCHARGE INSTRUCTIONS  1) The drugs that you were given will stay in your system until tomorrow so for the next 24 hours you should  not: A) Drive an automobile B) Make any legal decisions C) Drink any alcoholic beverage  2) You may resume regular meals tomorrow.  Today it is better to start with liquids and gradually work up to solid foods. You may eat anything you prefer, but it is better to start with liquids, then soup and crackers, and gradually work up to solid foods.  3) Please notify your doctor immediately if you have any unusual bleeding, trouble breathing, redness and pain at the surgery site, drainage, fever, or pain not relieved by medication.  4) Additional Instructions:  Please contact your physician with any problems or Same Day Surgery at [phone], Monday through Friday 6 am to 4 pm, or [facility] number at [phone].  

## 2020-03-21 NOTE — Op Note (Signed)
03/21/2020  2:16 PM  PATIENT:  Megan Rollins  64 y.o. 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    The risks benefits and alternatives were discussed with the patient preoperatively including but not limited to the risks of infection, bleeding, nerve injury, persistent pain or weakness, failure of the hardware, re-tear of the rotator cuff and the need for further surgery. Medical risks include DVT and pulmonary embolism, myocardial infarction, stroke, pneumonia, respiratory failure and death. Patient understood these risks and wished to proceed.  OPERATIVE IMPLANTS: Conmed Suture Bridge with 2 medial Y-Knot anchors and 2 lateral crossFT anchors  OPERATIVE PROCEDURE: The patient was met in the preoperative area. The RIGHT shoulder was signed with my initials according the hospital's correct site of surgery protocol. The patient is brought to the OR and underwent a supraclavicular block and general endotracheal intubation by the anesthesia service.  The patient was placed in a beachchair position.  A spider arm positioner was used for this case. Examination under anesthesia revealed full passive ROM and a negative sulcus sign. There was anterior/posterior instability.  The patient was prepped and draped in a sterile fashion. A timeout was performed to verify the patient's name, date of birth, medical record  number, correct site of surgery and correct procedure to be performed there was also used to verify the patient received antibiotics that all appropriate instruments, implants and radiographs studies were available in the room. Once all in attendance were in agreement case began.  Bony landmarks were drawn out with a surgical marker along with proposed arthroscopy incisions. These were pre-injected with 0.25% marcaine with epi. An 11 blade was used to establish a posterior portal through which the arthroscope was placed in the glenohumeral joint. A full diagnostic examination of the shoulder was performed.  The anterior portal was established under direct visualization with an 18-gauge spinal needle.  A 5.75 mm arthroscopic cannula was placed through the anterior portal.   The arthroscopic shaver was then used to debride the frayed edges of the labrum. There were no anterior or superior labral tears seen.  Subscapularis tendon was intact. Patient had a near full-thickness tear involving the supraspinatus AND infraspinatus with minimal retraction. There were no loose bodies within the inferior recess and no evidence of HAGL lesion.  The arthroscope was then placed in the subacromial space. A lateral portal was then established using an 18-gauge spinal needle for localization.   The greater tuberosity was debrided using a 4.0 mm resector shaver blade to remove all remaining foreign fibers of the rotator cuff.  Debridement was performed until punctate bleeding was seen at the greater tuberosity footprint, which will allow for rotator cuff healing. Using the a double row suture bridge system medial anchors with fiber tape were placed. The cuff was mobilized and the tape passed through the rotator cuff. The tape was then crossed in usual fashion and fixated on the lateral side with two SwiveLock anchors.  The final construct was stable and moved as a unit with excellent coverage of the humeral head.  Minimal  bursal tissue was encountered within the subacromial space.   Final arthroscopic images were taken. Arthroscopic images were then removed.  All incisions were copiously irrigated. Skin closure for the arthroscopic incisions was performed with 3-0 nylon.  A dry sterile dressing including Steri-Strips was applied .  The patient was placed in an abduction sling.  All sharp and instrument counts were correct at the conclusion of the case. I was scrubbed and present for the entire case. I spoke with the patient's family in the post-op consultation room and informed them that the case had been performed without complication and the patient was stable in recovery room.   Kurtis Bushman, MD

## 2020-03-22 ENCOUNTER — Encounter: Payer: Self-pay | Admitting: Orthopedic Surgery

## 2020-03-23 NOTE — Anesthesia Postprocedure Evaluation (Signed)
Anesthesia Post Note  Patient: Megan Rollins  Procedure(s) Performed: SHOULDER ARTHROSCOPY WITH ROTATOR CUFF REPAIR (Right Shoulder)  Patient location during evaluation: PACU Anesthesia Type: General Level of consciousness: awake and alert Pain management: pain level controlled Vital Signs Assessment: post-procedure vital signs reviewed and stable Respiratory status: spontaneous breathing, nonlabored ventilation and respiratory function stable Cardiovascular status: blood pressure returned to baseline and stable Postop Assessment: no apparent nausea or vomiting Anesthetic complications: no   No complications documented.   Last Vitals:  Vitals:   03/21/20 1513 03/21/20 1524  BP: 127/63   Pulse: 75 77  Resp: 16 16  Temp:  (!) 36.2 C  SpO2: 96% 97%    Last Pain:  Vitals:   03/22/20 0918  TempSrc:   PainSc: [place]

## 2020-03-30 NOTE — Interval H&P Note (Signed)
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  The various methods of treatment have been discussed with the patient and family. After consideration of risks, benefits and other options for treatment, the patient has consented to  Procedure(s): SHOULDER ARTHROSCOPY WITH ROTATOR CUFF REPAIR (Right) as a surgical intervention.  The patient's history has been reviewed, patient examined, no change in status, stable for surgery.  I have reviewed the patient's chart and labs.  Questions were answered to the patient's satisfaction.     Lovell Sheehan

## 2020-04-21 ENCOUNTER — Ambulatory Visit: Payer: BC Managed Care – PPO

## 2020-05-20 ENCOUNTER — Ambulatory Visit
Admission: RE | Admit: 2020-05-20 | Discharge: 2020-05-20 | Disposition: A | Discharge status: Home / Self Care | Payer: BC Managed Care – PPO | Source: Ambulatory Visit | Attending: Nurse Practitioner | Admitting: Nurse Practitioner

## 2020-05-20 ENCOUNTER — Other Ambulatory Visit: Payer: Self-pay

## 2020-05-20 DIAGNOSIS — Z1231 Encounter for screening mammogram for malignant neoplasm of breast: Secondary | ICD-10-CM

## 2020-06-01 ENCOUNTER — Other Ambulatory Visit (HOSPITAL_COMMUNITY): Payer: Self-pay | Admitting: Orthopedic Surgery

## 2020-06-01 ENCOUNTER — Other Ambulatory Visit: Payer: Self-pay | Admitting: Orthopedic Surgery

## 2020-06-01 DIAGNOSIS — Z9889 Other specified postprocedural states: Secondary | ICD-10-CM

## 2020-06-01 DIAGNOSIS — M25511 Pain in right shoulder: Secondary | ICD-10-CM

## 2020-06-13 ENCOUNTER — Ambulatory Visit
Admission: RE | Admit: 2020-06-13 | Discharge: 2020-06-13 | Disposition: A | Discharge status: Home / Self Care | Payer: BC Managed Care – PPO | Source: Ambulatory Visit | Attending: Orthopedic Surgery | Admitting: Orthopedic Surgery

## 2020-06-13 ENCOUNTER — Other Ambulatory Visit: Payer: Self-pay

## 2020-06-13 DIAGNOSIS — Z9889 Other specified postprocedural states: Secondary | ICD-10-CM | POA: Insufficient documentation

## 2020-06-13 DIAGNOSIS — M75121 Complete rotator cuff tear or rupture of right shoulder, not specified as traumatic: Secondary | ICD-10-CM | POA: Insufficient documentation

## 2020-06-13 DIAGNOSIS — M25511 Pain in right shoulder: Secondary | ICD-10-CM

## 2020-06-13 HISTORY — DX: Other specified postprocedural states: Z98.890

## 2020-06-13 MED ORDER — IOHEXOL 180 MG/ML  SOLN
13.0000 mL | Freq: Once | INTRAMUSCULAR | Status: AC | PRN
  Administered 2020-06-13: 09:00:00 13 mL

## 2020-06-13 MED ORDER — SODIUM CHLORIDE (PF) 0.9 % IJ SOLN
7.0000 mL | Freq: Once | INTRAMUSCULAR | Status: AC
  Administered 2020-06-13: 09:00:00 7 mL

## 2020-06-13 MED ORDER — LIDOCAINE HCL (PF) 1 % IJ SOLN
4.0000 mL | Freq: Once | INTRAMUSCULAR | Status: AC
  Administered 2020-06-13: 09:00:00 4 mL
  Filled 2020-06-13: qty 4

## 2020-06-13 MED ORDER — GADOBUTROL 1 MMOL/ML IV SOLN
0.0500 mL | Freq: Once | INTRAVENOUS | Status: AC | PRN
  Administered 2020-06-13: 09:00:00 0.05 mL

## 2020-06-13 NOTE — Progress Notes (Signed)
Contrast inj. Right shoulder for MR right shoulder arthrogram        Under fluoro, 20 g needle inserted into right shoulder with contrast administration for MR shoulder arthrogram.   No immediate complications.

## 2020-07-05 DIAGNOSIS — Z96611 Presence of right artificial shoulder joint: Secondary | ICD-10-CM | POA: Insufficient documentation

## 2020-07-05 HISTORY — DX: Presence of right artificial shoulder joint: Z96.611

## 2021-04-03 ENCOUNTER — Other Ambulatory Visit: Payer: Self-pay | Admitting: Nurse Practitioner

## 2021-04-03 DIAGNOSIS — Z1231 Encounter for screening mammogram for malignant neoplasm of breast: Secondary | ICD-10-CM

## 2021-05-23 ENCOUNTER — Ambulatory Visit
Admission: RE | Admit: 2021-05-23 | Discharge: 2021-05-23 | Disposition: A | Discharge status: Home / Self Care | Payer: BC Managed Care – PPO | Source: Ambulatory Visit | Attending: Nurse Practitioner | Admitting: Nurse Practitioner

## 2021-05-23 DIAGNOSIS — Z1231 Encounter for screening mammogram for malignant neoplasm of breast: Secondary | ICD-10-CM

## 2022-04-09 ENCOUNTER — Other Ambulatory Visit: Payer: Self-pay | Admitting: Nurse Practitioner

## 2022-04-09 DIAGNOSIS — Z1231 Encounter for screening mammogram for malignant neoplasm of breast: Secondary | ICD-10-CM

## 2022-05-28 ENCOUNTER — Ambulatory Visit
Admission: RE | Admit: 2022-05-28 | Discharge: 2022-05-28 | Disposition: A | Discharge status: Home / Self Care | Payer: Medicare Other | Source: Ambulatory Visit | Attending: Nurse Practitioner | Admitting: Nurse Practitioner

## 2022-05-28 DIAGNOSIS — Z1231 Encounter for screening mammogram for malignant neoplasm of breast: Secondary | ICD-10-CM

## 2022-08-16 ENCOUNTER — Other Ambulatory Visit: Payer: Self-pay | Admitting: Nurse Practitioner

## 2022-08-17 LAB — COMPREHENSIVE METABOLIC PANEL
ALT: 15 IU/L (ref 0–32)
AST: 16 IU/L (ref 0–40)
Albumin/Globulin Ratio: 2 (ref 1.2–2.2)
Albumin: 4.2 g/dL (ref 3.9–4.9)
Alkaline Phosphatase: 84 IU/L (ref 44–121)
BUN/Creatinine Ratio: 21 (ref 12–28)
BUN: 17 mg/dL (ref 8–27)
Bilirubin Total: 0.4 mg/dL (ref 0.0–1.2)
CO2: 26 mmol/L (ref 20–29)
Calcium: 9.4 mg/dL (ref 8.7–10.3)
Chloride: 104 mmol/L (ref 96–106)
Creatinine, Ser: 0.8 mg/dL (ref 0.57–1.00)
Globulin, Total: 2.1 g/dL (ref 1.5–4.5)
Glucose: 89 mg/dL (ref 70–99)
Potassium: 4.4 mmol/L (ref 3.5–5.2)
Sodium: 143 mmol/L (ref 134–144)
Total Protein: 6.3 g/dL (ref 6.0–8.5)
eGFR: 82 mL/min/{1.73_m2} (ref >59)

## 2022-08-17 LAB — LIPID PANEL W/O CHOL/HDL RATIO
Cholesterol, Total: 188 mg/dL (ref 100–199)
HDL: 68 mg/dL (ref >39)
LDL Chol Calc (NIH): 106 mg/dL — ABNORMAL HIGH (ref 0–99)
Triglycerides: 79 mg/dL (ref 0–149)
VLDL Cholesterol Cal: 14 mg/dL (ref 5–40)

## 2022-08-17 LAB — TSH: TSH: 1.74 u[IU]/mL (ref 0.450–4.500)

## 2022-08-20 ENCOUNTER — Ambulatory Visit (INDEPENDENT_AMBULATORY_CARE_PROVIDER_SITE_OTHER): Payer: Medicare Other | Admitting: Nurse Practitioner

## 2022-08-20 ENCOUNTER — Other Ambulatory Visit: Payer: Self-pay | Admitting: Nurse Practitioner

## 2022-08-20 VITALS — BP 106/68 | HR 75 | Ht 68.0 in | Wt 170.0 lb

## 2022-08-20 DIAGNOSIS — M25511 Pain in right shoulder: Secondary | ICD-10-CM | POA: Diagnosis not present

## 2022-08-20 DIAGNOSIS — F419 Anxiety disorder, unspecified: Secondary | ICD-10-CM

## 2022-08-20 DIAGNOSIS — G2581 Restless legs syndrome: Secondary | ICD-10-CM | POA: Diagnosis not present

## 2022-08-20 DIAGNOSIS — F5101 Primary insomnia: Secondary | ICD-10-CM | POA: Diagnosis not present

## 2022-08-20 DIAGNOSIS — M810 Age-related osteoporosis without current pathological fracture: Secondary | ICD-10-CM

## 2022-08-20 MED ORDER — ALENDRONATE SODIUM 70 MG PO TABS: 70.0000 mg | ORAL_TABLET | ORAL | 3 refills | Status: DC

## 2022-08-20 NOTE — Progress Notes (Signed)
Established Patient Office Visit  Subjective:  Patient ID: Megan Rollins, female    DOB: 11-Aug-1955  Age: 67 y.o. MRN: [id number]  Chief Complaint  Patient presents with   Follow-up    6 month followup and review of recent fasting labs.  Needs refills today.  Neg ROS.       Past Medical History:  Diagnosis Date   Arthritis    shoulder   Cancer ([place]) 2001   left breast   Cancer ([place])    Depression    Hx of migraines     Social History   Socioeconomic History   Marital status: Single    Spouse name: Bill   Number of children: 2   Years of education: College   Highest education level: Not on file  Occupational History    Employer: [employer]  Tobacco Use   Smoking status: Never   Smokeless tobacco: Never  Vaping Use   Vaping Use: Never used  Substance and Sexual Activity   Alcohol use: Yes    Comment: Seldom   Drug use: No   Sexual activity: Not on file  Other Topics Concern   Not on file  Social History Narrative   Lives with husband and stepdaughter in a one story home.  Has 2 children.     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   Social Determinants of Health   Financial Resource Strain: Not on file  Food Insecurity: Not on file  Transportation Needs: Not on file  Physical Activity: Not on file  Stress: Not on file  Social Connections: Not on file  Intimate Partner Violence: Not on file    Family History  Problem Relation Age of Onset   Liver cancer Father    Alcohol abuse Father    Breast cancer Mother    Heart attack Mother    Heart attack Brother    Narcolepsy Brother    Heart attack Brother    Alcohol abuse Sister    Hepatitis Sister    Healthy Daughter    Other Son     Allergies  Allergen Reactions   Biaxin [Clarithromycin] Nausea Only   Penicillins Rash   Sulfa Antibiotics Rash    Review of Systems  All other systems reviewed and are negative.      Objective:   BP 106/68   Pulse 75   Ht 5' 8"$  (1.727 m)    Wt 170 lb (77.1 kg)   SpO2 97%   BMI 25.85 kg/m   Vitals:   08/20/22 1305  BP: 106/68  Pulse: 75  Height: 5' 8"$  (1.727 m)  Weight: 170 lb (77.1 kg)  SpO2: 97%  BMI (Calculated): 25.85    Physical Exam Vitals reviewed.  Constitutional:      Appearance: Normal appearance.  HENT:     Head: Normocephalic.     Nose: Nose normal.     Mouth/Throat:     Mouth: Mucous membranes are moist.  Eyes:     Pupils: Pupils are equal, round, and reactive to light.  Cardiovascular:     Rate and Rhythm: Normal rate and regular rhythm.  Pulmonary:     Effort: Pulmonary effort is normal.     Breath sounds: Normal breath sounds.  Abdominal:     General: Bowel sounds are normal.     Palpations: Abdomen is soft.  Musculoskeletal:        General: Tenderness present.     Cervical back: Neck supple.  Skin:    General: Skin is warm and dry.  Neurological:     Mental Status: She is alert and oriented to person, place, and time.  Psychiatric:        Mood and Affect: Mood normal.        Behavior: Behavior normal.      No results found for any visits on 08/20/22.  Recent Results (from the past 2160 hour(s))  Comprehensive metabolic panel     Status: None   Collection Time: 08/16/22  8:41 AM  Result Value Ref Range   Glucose 89 70 - 99 mg/dL   BUN 17 8 - 27 mg/dL   Creatinine, Ser 0.80 0.57 - 1.00 mg/dL   eGFR 82 >59 mL/min/1.73   BUN/Creatinine Ratio 21 12 - 28   Sodium 143 134 - 144 mmol/L   Potassium 4.4 3.5 - 5.2 mmol/L   Chloride 104 96 - 106 mmol/L   CO2 26 20 - 29 mmol/L   Calcium 9.4 8.7 - 10.3 mg/dL   Total Protein 6.3 6.0 - 8.5 g/dL   Albumin 4.2 3.9 - 4.9 g/dL   Globulin, Total 2.1 1.5 - 4.5 g/dL   Albumin/Globulin Ratio 2.0 1.2 - 2.2   Bilirubin Total 0.4 0.0 - 1.2 mg/dL   Alkaline Phosphatase 84 44 - 121 IU/L   AST 16 0 - 40 IU/L   ALT 15 0 - 32 IU/L  Lipid Panel w/o Chol/HDL Ratio     Status: Abnormal   Collection Time: 08/16/22  8:41 AM  Result Value Ref Range    Cholesterol, Total 188 100 - 199 mg/dL   Triglycerides 79 0 - 149 mg/dL   HDL 68 >39 mg/dL   VLDL Cholesterol Cal 14 5 - 40 mg/dL   LDL Chol Calc (NIH) 106 (H) 0 - 99 mg/dL  TSH     Status: None   Collection Time: 08/16/22  8:41 AM  Result Value Ref Range   TSH 1.740 0.450 - 4.500 uIU/mL      Assessment & Plan:   Problem List Items Addressed This Visit   None   No follow-ups on file.   Total time spent: 15 minutes  Evern Bio, NP  08/20/2022

## 2022-08-20 NOTE — Patient Instructions (Signed)
1) Follow up appt in 6 months, fasting labs prior 2) Needs refills today 3) Labs look great, follow low chol diet and restart zetia

## 2022-08-21 ENCOUNTER — Other Ambulatory Visit: Payer: Self-pay

## 2022-08-21 MED ORDER — METHOCARBAMOL 500 MG PO TABS: 500.0000 mg | ORAL_TABLET | Freq: Three times a day (TID) | ORAL | 1 refills | Status: DC | PRN

## 2022-08-24 ENCOUNTER — Other Ambulatory Visit: Payer: Self-pay

## 2022-08-24 MED ORDER — PHENTERMINE HCL 37.5 MG PO TABS: 37.5000 mg | ORAL_TABLET | Freq: Every day | ORAL | 0 refills | Status: DC

## 2022-09-23 ENCOUNTER — Other Ambulatory Visit: Payer: Self-pay | Admitting: Nurse Practitioner

## 2022-10-12 ENCOUNTER — Encounter: Payer: Self-pay | Admitting: Nurse Practitioner

## 2022-10-12 ENCOUNTER — Other Ambulatory Visit: Payer: Self-pay | Admitting: Nurse Practitioner

## 2022-10-12 MED ORDER — AZITHROMYCIN 250 MG PO TABS: ORAL_TABLET | ORAL | 0 refills | Status: AC

## 2022-10-21 ENCOUNTER — Other Ambulatory Visit: Payer: Self-pay | Admitting: Nurse Practitioner

## 2022-12-07 ENCOUNTER — Other Ambulatory Visit: Payer: Self-pay | Admitting: Nurse Practitioner

## 2022-12-18 DIAGNOSIS — T85848A Pain due to other internal prosthetic devices, implants and grafts, initial encounter: Secondary | ICD-10-CM | POA: Insufficient documentation

## 2022-12-18 HISTORY — DX: Pain due to other internal prosthetic devices, implants and grafts, initial encounter: T85.848A

## 2022-12-24 ENCOUNTER — Other Ambulatory Visit: Payer: Self-pay | Admitting: Family

## 2022-12-24 DIAGNOSIS — R7303 Prediabetes: Secondary | ICD-10-CM

## 2022-12-24 DIAGNOSIS — Z01812 Encounter for preprocedural laboratory examination: Secondary | ICD-10-CM

## 2022-12-26 ENCOUNTER — Other Ambulatory Visit: Payer: Medicare Other

## 2022-12-27 LAB — CMP14+EGFR
ALT: 15 IU/L (ref 0–32)
AST: 14 IU/L (ref 0–40)
Albumin: 4.3 g/dL (ref 3.9–4.9)
Alkaline Phosphatase: 82 IU/L (ref 44–121)
BUN/Creatinine Ratio: 18 (ref 12–28)
BUN: 14 mg/dL (ref 8–27)
Bilirubin Total: 0.3 mg/dL (ref 0.0–1.2)
CO2: 24 mmol/L (ref 20–29)
Calcium: 9.7 mg/dL (ref 8.7–10.3)
Chloride: 104 mmol/L (ref 96–106)
Creatinine, Ser: 0.77 mg/dL (ref 0.57–1.00)
Globulin, Total: 2.1 g/dL (ref 1.5–4.5)
Glucose: 113 mg/dL — ABNORMAL HIGH (ref 70–99)
Potassium: 4.6 mmol/L (ref 3.5–5.2)
Sodium: 141 mmol/L (ref 134–144)
Total Protein: 6.4 g/dL (ref 6.0–8.5)
eGFR: 86 mL/min/{1.73_m2} (ref >59)

## 2022-12-27 LAB — CBC WITH DIFF/PLATELET
Basophils Absolute: 0 10*3/uL (ref 0.0–0.2)
Basos: 1 %
EOS (ABSOLUTE): 0.1 10*3/uL (ref 0.0–0.4)
Eos: 2 %
Hematocrit: 39.4 % (ref 34.0–46.6)
Hemoglobin: 13.2 g/dL (ref 11.1–15.9)
Immature Grans (Abs): 0 10*3/uL (ref 0.0–0.1)
Immature Granulocytes: 0 %
Lymphocytes Absolute: 1.6 10*3/uL (ref 0.7–3.1)
Lymphs: 34 %
MCH: 30.5 pg (ref 26.6–33.0)
MCHC: 33.5 g/dL (ref 31.5–35.7)
MCV: 91 fL (ref 79–97)
Monocytes Absolute: 0.4 10*3/uL (ref 0.1–0.9)
Monocytes: 8 %
Neutrophils Absolute: 2.6 10*3/uL (ref 1.4–7.0)
Neutrophils: 55 %
Platelets: 193 10*3/uL (ref 150–450)
RBC: 4.33 x10E6/uL (ref 3.77–5.28)
RDW: 11.9 % (ref 11.7–15.4)
WBC: 4.8 10*3/uL (ref 3.4–10.8)

## 2022-12-27 LAB — HEMOGLOBIN A1C
Est. average glucose Bld gHb Est-mCnc: 114 mg/dL
Hgb A1c MFr Bld: 5.6 % (ref 4.8–5.6)

## 2023-01-23 ENCOUNTER — Other Ambulatory Visit: Payer: Self-pay | Admitting: Nurse Practitioner

## 2023-02-14 ENCOUNTER — Other Ambulatory Visit: Payer: Medicare Other

## 2023-02-14 DIAGNOSIS — I10 Essential (primary) hypertension: Secondary | ICD-10-CM

## 2023-02-14 DIAGNOSIS — E785 Hyperlipidemia, unspecified: Secondary | ICD-10-CM

## 2023-02-14 DIAGNOSIS — R7303 Prediabetes: Secondary | ICD-10-CM

## 2023-02-15 LAB — LIPID PANEL
Chol/HDL Ratio: 2.5 ratio (ref 0.0–4.4)
Cholesterol, Total: 178 mg/dL (ref 100–199)
HDL: 70 mg/dL (ref >39)
LDL Chol Calc (NIH): 94 mg/dL (ref 0–99)
Triglycerides: 73 mg/dL (ref 0–149)
VLDL Cholesterol Cal: 14 mg/dL (ref 5–40)

## 2023-02-15 LAB — CBC WITH DIFFERENTIAL/PLATELET
Basophils Absolute: 0 10*3/uL (ref 0.0–0.2)
Basos: 1 %
EOS (ABSOLUTE): 0.1 10*3/uL (ref 0.0–0.4)
Eos: 2 %
Hematocrit: 40.3 % (ref 34.0–46.6)
Hemoglobin: 13.6 g/dL (ref 11.1–15.9)
Immature Grans (Abs): 0 10*3/uL (ref 0.0–0.1)
Immature Granulocytes: 0 %
Lymphocytes Absolute: 1.3 10*3/uL (ref 0.7–3.1)
Lymphs: 30 %
MCH: 30.4 pg (ref 26.6–33.0)
MCHC: 33.7 g/dL (ref 31.5–35.7)
MCV: 90 fL (ref 79–97)
Monocytes Absolute: 0.3 10*3/uL (ref 0.1–0.9)
Monocytes: 8 %
Neutrophils Absolute: 2.6 10*3/uL (ref 1.4–7.0)
Neutrophils: 59 %
Platelets: 213 10*3/uL (ref 150–450)
RBC: 4.48 x10E6/uL (ref 3.77–5.28)
RDW: 12.7 % (ref 11.7–15.4)
WBC: 4.4 10*3/uL (ref 3.4–10.8)

## 2023-02-15 LAB — CMP14+EGFR
ALT: 19 IU/L (ref 0–32)
AST: 19 IU/L (ref 0–40)
Albumin: 4.3 g/dL (ref 3.9–4.9)
Alkaline Phosphatase: 90 IU/L (ref 44–121)
BUN/Creatinine Ratio: 18 (ref 12–28)
BUN: 13 mg/dL (ref 8–27)
Bilirubin Total: 0.5 mg/dL (ref 0.0–1.2)
CO2: 23 mmol/L (ref 20–29)
Calcium: 9.4 mg/dL (ref 8.7–10.3)
Chloride: 103 mmol/L (ref 96–106)
Creatinine, Ser: 0.74 mg/dL (ref 0.57–1.00)
Globulin, Total: 2.2 g/dL (ref 1.5–4.5)
Glucose: 107 mg/dL — ABNORMAL HIGH (ref 70–99)
Potassium: 4.7 mmol/L (ref 3.5–5.2)
Sodium: 138 mmol/L (ref 134–144)
Total Protein: 6.5 g/dL (ref 6.0–8.5)
eGFR: 90 mL/min/{1.73_m2} (ref >59)

## 2023-02-15 LAB — HEMOGLOBIN A1C
Est. average glucose Bld gHb Est-mCnc: 114 mg/dL
Hgb A1c MFr Bld: 5.6 % (ref 4.8–5.6)

## 2023-02-15 LAB — TSH: TSH: 2.19 u[IU]/mL (ref 0.450–4.500)

## 2023-02-18 ENCOUNTER — Ambulatory Visit: Payer: Medicare Other | Admitting: Family

## 2023-02-18 ENCOUNTER — Ambulatory Visit: Payer: Medicare Other | Admitting: Cardiology

## 2023-02-18 ENCOUNTER — Encounter: Payer: Self-pay | Admitting: Family

## 2023-02-18 ENCOUNTER — Ambulatory Visit: Payer: Medicare Other | Admitting: Internal Medicine

## 2023-02-18 VITALS — BP 106/64 | HR 90 | Ht 68.0 in | Wt 181.4 lb

## 2023-02-18 DIAGNOSIS — Z0000 Encounter for general adult medical examination without abnormal findings: Secondary | ICD-10-CM | POA: Diagnosis not present

## 2023-02-18 DIAGNOSIS — M810 Age-related osteoporosis without current pathological fracture: Secondary | ICD-10-CM | POA: Diagnosis not present

## 2023-02-18 MED ORDER — ALENDRONATE SODIUM 70 MG PO TABS: 70.0000 mg | ORAL_TABLET | ORAL | 3 refills | Status: DC

## 2023-02-18 MED ORDER — CLONAZEPAM 2 MG PO TABS: 2.0000 mg | ORAL_TABLET | Freq: Two times a day (BID) | ORAL | 1 refills | Status: DC | PRN

## 2023-02-18 NOTE — Progress Notes (Signed)
Annual Wellness Visit  Patient: Megan Rollins, Female    DOB: [birth year], 67 y.o.   MRN: [id number] Visit Date: 02/18/2023  Today's Provider: Miki Kins, FNP  Subjective:    Chief Complaint  Patient presents with   Medicare Wellness    AWV   Annual Exam    AWV   Megan Rollins is a 67 y.o. female who presents today for her Annual Wellness Visit.  Patient is here for her Welcome to Medicare visit.    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   Past Surgical History:  Procedure Laterality Date   ABDOMINAL HYSTERECTOMY  2005   total   APPENDECTOMY     BREAST LUMPECTOMY Left    chemo and radiation 2001   BREAST SURGERY Left 2001   lumpectomy with radiation therapy and chemo therapy   CATARACT EXTRACTION, BILATERAL     CESAREAN SECTION  1984   KIDNEY SURGERY Right    as a child   OOPHORECTOMY Bilateral 2005   SHOULDER ARTHROSCOPY WITH OPEN ROTATOR CUFF REPAIR Right 06/18/2019   Procedure: SHOULDER ARTHROSCOPIC ROTATOR CUFF REPAIR, DISTAL CLAVICLE EXCISION, SUBACHROMIAL DECOMPRESSION, INTRA-ARTICULAR DEBRIDEMENT;  Surgeon: Lyndle Herrlich, MD;  Location: [facility];  Service: Orthopedics;  Laterality: Right;   SHOULDER ARTHROSCOPY WITH ROTATOR CUFF REPAIR Right 03/21/2020   Procedure: SHOULDER ARTHROSCOPY WITH ROTATOR CUFF REPAIR;  Surgeon: Lyndle Herrlich, MD;  Location: [facility];  Service: Orthopedics;  Laterality: Right;   SHOULDER SURGERY  2008   TIBIA FRACTURE SURGERY  2014   rod placed   Family History  Problem Relation Age of  Onset   Liver cancer Father    Alcohol abuse Father    Breast cancer Mother    Heart attack Mother    Heart attack Brother    Narcolepsy Brother    Heart attack Brother    Alcohol abuse Sister    Hepatitis Sister    Healthy Daughter    Other Son    Social History   Socioeconomic History   Marital status: Divorced    Spouse name: Optometrist   Number of children: 2   Years of education: Automotive engineer   Highest education level: Not on file  Occupational History    Employer: [employer]  Tobacco Use   Smoking status: Never   Smokeless tobacco: Never  Vaping Use   Vaping status: Never Used  Substance and Sexual Activity   Alcohol use: Yes    Comment: Seldom   Drug use: No   Sexual activity: Not on file  Other Topics Concern   Not on file  Social History Narrative   Lives with roommate in a one story home.  Has 2 children.     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   Social Determinants of Health   Financial Resource Strain: Low Risk  (02/14/2023)   Overall Financial Resource Strain (CARDIA)    Difficulty of Paying Living Expenses: Not very hard  Food  Insecurity: No Food Insecurity (02/18/2023)   Hunger Vital Sign    Worried About Running Out of Food in the Last Year: Never true    Ran Out of Food in the Last Year: Never true  Transportation Needs: No Transportation Needs (02/14/2023)   PRAPARE - Administrator, Civil Service (Medical): No    Lack of Transportation (Non-Medical): No  Physical Activity: Insufficiently Active (02/14/2023)   Exercise Vital Sign    Days of Exercise per Week: 2 days    Minutes of Exercise per Session: 20 min  Stress: No Stress Concern Present (02/14/2023)   [employer] of Occupational Health - Occupational Stress Questionnaire    Feeling of Stress : Only a little  Social Connections: Unknown (02/14/2023)   Social Connection and Isolation Panel [NHANES]    Frequency of Communication with Friends and  Family: Twice a week    Frequency of Social Gatherings with Friends and Family: Once a week    Attends Religious Services: Not on Marketing executive or Organizations: Yes    Attends Engineer, structural: More than 4 times per year    Marital Status: Divorced  Catering manager Violence: Not on file    Medications: Outpatient Medications Prior to Visit  Medication Sig   alendronate (FOSAMAX) 70 MG tablet Take 1 tablet (70 mg total) by mouth once a week.   ASPIRIN LOW DOSE 81 MG tablet Take 81 mg by mouth daily.   cetirizine (ZYRTEC) 10 MG tablet Take 1 tablet by mouth daily.   citalopram (CELEXA) 40 MG tablet Take 40 mg by mouth daily.   clonazePAM (KLONOPIN) 2 MG tablet TAKE 1 TABLET BY MOUTH TWICE DAILY AS NEEDED FOR MOMENTS OF HIGH ANXIETY   ezetimibe (ZETIA) 10 MG tablet TAKE 1 TABLET BY MOUTH EVERY DAY IN THE EVENING   gabapentin (NEURONTIN) 300 MG capsule TAKE 1 CAPSULE BY MOUTH NIGHTLY AT BEDTIME.   magnesium gluconate (MAGONATE) 500 MG tablet Take 500 mg by mouth 2 (two) times daily.   Multiple Vitamin (MULTIVITAMIN WITH MINERALS) TABS tablet Take 1 tablet by mouth daily.   [DISCONTINUED] Calcium Carbonate-Vitamin D (CALCIUM + D PO) Take 1 capsule by mouth daily.   [DISCONTINUED] diclofenac (VOLTAREN) 75 MG EC tablet Take 75 mg by mouth 2 (two) times daily.   [DISCONTINUED] fluticasone (FLONASE) 50 MCG/ACT nasal spray Place 1 spray into both nostrils daily as needed for allergies.    [DISCONTINUED] methocarbamol (ROBAXIN) 500 MG tablet TAKE 1 TABLET BY MOUTH EVERY 8 HOURS AS NEEDED   [DISCONTINUED] mupirocin ointment (BACTROBAN) 2 % Apply 1 Application topically 2 (two) times daily.   [DISCONTINUED] phentermine (ADIPEX-P) 37.5 MG tablet TAKE 1 TABLET BY MOUTH DAILY BEFORE BREAKFAST   [DISCONTINUED] tiZANidine (ZANAFLEX) 4 MG tablet Take 4 mg by mouth at bedtime.   [DISCONTINUED] varenicline (CHANTIX) 1 MG tablet AFTER FINISHING STARTER PACK, TAKE 1 TABLET BY  MOUTH TWICE DAILY   No facility-administered medications prior to visit.    Allergies  Allergen Reactions   Clarithromycin Nausea Only   Penicillins Rash   Sulfa Antibiotics Rash    Patient Care Team: Miki Kins, FNP as PCP - General (Family Medicine) Lemar Livings, Merrily Pew, MD (General Surgery)  Review of Systems  All other systems reviewed and are negative.      Objective:    Vitals: BP 106/64   Pulse 90   Ht 5\' 8"  (1.727 m)   Wt 181 lb 6.4 oz (  82.3 kg)   SpO2 96%   BMI 27.58 kg/m    Physical Exam Vitals and nursing note reviewed.  Constitutional:      Appearance: Normal appearance. She is normal weight.  HENT:     Head: Normocephalic.  Eyes:     Extraocular Movements: Extraocular movements intact.     Conjunctiva/sclera: Conjunctivae normal.     Pupils: Pupils are equal, round, and reactive to light.  Cardiovascular:     Rate and Rhythm: Normal rate.  Pulmonary:     Effort: Pulmonary effort is normal.  Neurological:     General: No focal deficit present.     Mental Status: She is alert and oriented to person, place, and time. Mental status is at baseline.  Psychiatric:        Mood and Affect: Mood normal.        Behavior: Behavior normal.        Thought Content: Thought content normal.        Judgment: Judgment normal.     Most recent functional status assessment:    02/14/2023   12:50 PM  In your present state of health, do you have any difficulty performing the following activities:  Hearing? 0  Vision? 0  Difficulty concentrating or making decisions? 0  Walking or climbing stairs? 0  Dressing or bathing? 0  Doing errands, shopping? 0  Preparing Food and eating ? N  Using the Toilet? N  In the past six months, have you accidently leaked urine? N  Do you have problems with loss of bowel control? N  Managing your Medications? N  Managing your Finances? N  Housekeeping or managing your Housekeeping? N    Most recent fall risk  assessment:    02/14/2023   12:50 PM  Fall Risk   Falls in the past year? 1  Number falls in past yr: 1  Injury with Fall? 0     Most recent depression screenings:    02/18/2023   11:17 AM  PHQ 2/9 Scores  PHQ - 2 Score 1    Most recent cognitive screening:    02/18/2023   11:17 AM  6CIT Screen  What Year? 0 points  What month? 0 points  What time? 0 points  Count back from 20 0 points  Months in reverse 0 points  Repeat phrase 0 points  Total Score 0 points    No results found for any visits on 02/18/23.     Assessment & Plan:      Annual wellness visit done today including the all of the following: Reviewed patient's Family Medical History Reviewed and updated list of patient's medical providers Assessment of cognitive impairment was done Assessed patient's functional ability Established a written schedule for health screening services Health Risk Assessent Completed and Reviewed  Exercise Activities and Dietary recommendations  Goals       Activity and Exercise Increased      Evidence-based guidance:  Review current exercise levels.  Assess patient perspective on exercise or activity level, barriers to increasing activity, motivation and readiness for change.  Recommend or set healthy exercise goal based on individual tolerance.  Encourage small steps toward making change in amount of exercise or activity.  Urge reduction of sedentary activities or screen time.  Promote group activities within the community or with family or support person.  Consider referral to rehabiliation therapist for assessment and exercise/activity plan.   Notes:       Disease Progression Minimized or Managed  Evidence-based guidance:  Identify current smoking/tobacco use; provide smoking cessation intervention.  Assess symptom control by the frequency and type of symptoms, reliever use and activity limitation at every encounter.  Assess risk for exacerbation (flare up) by  evaluating spirometry, pulse oximetry, reliever use, presentation of symptoms and activity limitation; anticipate treatment adjustment based on risks and resources.  Develop and/or review and reinforce use of COPD rescue (action) plan even when symptoms are controlled or infrequent.  Ask patient to bring inhaler to all visits; assess and reinforce correct technique; address barriers to proper inhaler use, such as older age, use of multiple devices and lack of understanding.   Identify symptom triggers, such as smoking, virus, weather change, emotional upset, exercise, obesity and environmental allergen; consider reduction of work-exposure versus elimination to avoid compromising employment.  Correlate presentation to comorbidity, such as diabetes, heart failure, obstructive sleep apnea, depression and anxiety, which may worsen symptoms.  Prepare for individualized pharmacologic therapy that may include LABA (long-acting beta-2 agonist), LAMA (long-acting muscarinic antagonist), SABA (short-acting beta-2 agonist) oral or inhaled corticosteroid.  Promote participation in pulmonary rehabilitation for breathing exercises, skills training, improved exercise capacity, mood and quality of life; address barriers to participation.  Promote physical activity or exercise to improve or maintain exercise capacity, based on tolerance that may include walking, water exercise, cycling or limb muscle strength training.  Promote use of energy conservation and activity pacing techniques.  Promote use of breathing and coughing techniques, such as inspiratory muscle training, pursed-lip breathing, diaphragmatic breathing, pranayama yoga breathing or huff cough.  Screen for malnutrition risk factors, such as unintentional weight loss and poor oral intake; refer to dietitian if identified.  Consider recommendation for oral drink supplement or multivitamin and mineral supplements if suspect inadequate oral intake or  micronutrient deficiencies.   Screen for obstructive sleep apnea; prepare patient for polysomnography based on risk and presentation.  Prepare patient for use of long-term oxygen and noninvasive ventilation to relieve hypercapnia, hypoxemia, obstructive sleep apnea and reduce work of breathing.  Prepare patient with worsening disease for surgical interventions that may include bronchoscopy, lung volume reduction surgery, bullectomy or lung transplantation.   Notes:       Harm or Injury Prevented      Evidence-based guidance:  Assess for injurious side effects of medication, especially those that may increase fall risk, including psychotropic drugs, such as benzodiazepine, as well as cardiovascular drugs, such as antiarrhythmic, digoxin and   diuretic; consider discontinuation of medications when appropriate.  Review fall risk screen; assess fall history, gait, balance, mobility, muscle weakness, fear of falling, visual or cognitive impairment, urinary incontinence and home hazards.  Review and address environmental factors, such as poor lighting, loosened or frayed carpets and trailing electrical cables, that may increase the risk of falling.  Provide anticipatory guidance regarding fall risk, as well as physical and environmental barriers to safety; include how to summon help if a fall occurs and how to avoid a delay in treatment.  Consider referral to physical or occupational therapist for evaluation and recommendations for assistive or adaptive devices, orthoses, strength and balance training.  Include patient and family in education concerning fall-prevention measures.   Notes:       Weight (lb) < 160 lb (72.6 kg) (pt-stated)      Continue current meds.  Will adjust as needed based on results.  The patient is asked to make an attempt to improve diet and exercise patterns to aid in medical management of this problem. Addressed importance  of increasing and maintaining water intake.           Immunization History  Administered Date(s) Administered   Influenza, Quadrivalent, Recombinant, Inj, Pf 05/03/2019   Influenza,inj,Quad PF,6+ Mos 05/03/2016   Influenza-Unspecified 03/27/2017   Tdap 08/02/2014    Health Maintenance  Topic Date Due   HIV Screening  Never done   Hepatitis C Screening  Never done   PAP SMEAR-Modifier  Never done   Colonoscopy  Never done   DEXA SCAN  05/13/2022   INFLUENZA VACCINE  05/02/2023 (Originally 01/31/2023)   Medicare Annual Wellness (AWV)  02/18/2024   MAMMOGRAM  05/28/2024   DTaP/Tdap/Td (2 - Td or Tdap) 08/02/2024   HPV VACCINES  Aged Out   Pneumonia Vaccine 76+ Years old  Discontinued   COVID-19 Vaccine  Discontinued   Zoster Vaccines- Shingrix  Discontinued     Discussed health benefits of physical activity, and encouraged her to engage in regular exercise appropriate for her age and condition.      Miki Kins, FNP   02/18/2023

## 2023-02-18 NOTE — Patient Instructions (Signed)

## 2023-02-23 ENCOUNTER — Encounter: Payer: Self-pay | Admitting: Family

## 2023-02-25 ENCOUNTER — Other Ambulatory Visit: Payer: Self-pay

## 2023-02-25 MED ORDER — PHENTERMINE HCL 37.5 MG PO CAPS: 37.5000 mg | ORAL_CAPSULE | ORAL | 2 refills | Status: DC

## 2023-03-21 ENCOUNTER — Ambulatory Visit: Payer: Medicare Other

## 2023-03-21 DIAGNOSIS — Z09 Encounter for follow-up examination after completed treatment for conditions other than malignant neoplasm: Secondary | ICD-10-CM | POA: Diagnosis not present

## 2023-03-21 DIAGNOSIS — D122 Benign neoplasm of ascending colon: Secondary | ICD-10-CM | POA: Diagnosis not present

## 2023-03-21 DIAGNOSIS — Z86010 Personal history of colonic polyps: Secondary | ICD-10-CM | POA: Diagnosis not present

## 2023-04-03 ENCOUNTER — Other Ambulatory Visit: Payer: Self-pay | Admitting: Family

## 2023-04-03 MED ORDER — CITALOPRAM HYDROBROMIDE 40 MG PO TABS: 40.0000 mg | ORAL_TABLET | Freq: Every day | ORAL | 3 refills | Status: DC

## 2023-04-11 ENCOUNTER — Other Ambulatory Visit: Payer: Self-pay | Admitting: Family

## 2023-04-11 MED ORDER — CITALOPRAM HYDROBROMIDE 40 MG PO TABS: 40.0000 mg | ORAL_TABLET | Freq: Every day | ORAL | 3 refills | Status: DC

## 2023-04-16 ENCOUNTER — Other Ambulatory Visit: Payer: Self-pay | Admitting: Family

## 2023-04-16 DIAGNOSIS — Z1231 Encounter for screening mammogram for malignant neoplasm of breast: Secondary | ICD-10-CM

## 2023-04-29 ENCOUNTER — Other Ambulatory Visit: Payer: Self-pay | Admitting: Family

## 2023-05-14 ENCOUNTER — Ambulatory Visit (INDEPENDENT_AMBULATORY_CARE_PROVIDER_SITE_OTHER): Payer: Medicare Other

## 2023-05-14 DIAGNOSIS — M8589 Other specified disorders of bone density and structure, multiple sites: Secondary | ICD-10-CM

## 2023-05-14 DIAGNOSIS — M810 Age-related osteoporosis without current pathological fracture: Secondary | ICD-10-CM | POA: Diagnosis not present

## 2023-05-16 ENCOUNTER — Other Ambulatory Visit: Payer: Medicare Other

## 2023-05-16 ENCOUNTER — Other Ambulatory Visit: Payer: Self-pay | Admitting: Family

## 2023-05-16 DIAGNOSIS — Z1329 Encounter for screening for other suspected endocrine disorder: Secondary | ICD-10-CM

## 2023-05-16 DIAGNOSIS — I10 Essential (primary) hypertension: Secondary | ICD-10-CM

## 2023-05-16 DIAGNOSIS — R7303 Prediabetes: Secondary | ICD-10-CM

## 2023-05-16 DIAGNOSIS — Z13220 Encounter for screening for lipoid disorders: Secondary | ICD-10-CM

## 2023-05-17 LAB — CBC WITH DIFF/PLATELET
Basophils Absolute: 0 10*3/uL (ref 0.0–0.2)
Basos: 1 %
EOS (ABSOLUTE): 0.1 10*3/uL (ref 0.0–0.4)
Eos: 2 %
Hematocrit: 39.8 % (ref 34.0–46.6)
Hemoglobin: 12.9 g/dL (ref 11.1–15.9)
Immature Grans (Abs): 0 10*3/uL (ref 0.0–0.1)
Immature Granulocytes: 0 %
Lymphocytes Absolute: 1.2 10*3/uL (ref 0.7–3.1)
Lymphs: 20 %
MCH: 30.4 pg (ref 26.6–33.0)
MCHC: 32.4 g/dL (ref 31.5–35.7)
MCV: 94 fL (ref 79–97)
Monocytes Absolute: 0.4 10*3/uL (ref 0.1–0.9)
Monocytes: 7 %
Neutrophils Absolute: 4.2 10*3/uL (ref 1.4–7.0)
Neutrophils: 70 %
Platelets: 198 10*3/uL (ref 150–450)
RBC: 4.25 x10E6/uL (ref 3.77–5.28)
RDW: 12.3 % (ref 11.7–15.4)
WBC: 5.9 10*3/uL (ref 3.4–10.8)

## 2023-05-17 LAB — CMP14+EGFR
ALT: 16 [IU]/L (ref 0–32)
AST: 19 [IU]/L (ref 0–40)
Albumin: 4.1 g/dL (ref 3.9–4.9)
Alkaline Phosphatase: 103 [IU]/L (ref 44–121)
BUN/Creatinine Ratio: 19 (ref 12–28)
BUN: 16 mg/dL (ref 8–27)
Bilirubin Total: 0.5 mg/dL (ref 0.0–1.2)
CO2: 21 mmol/L (ref 20–29)
Calcium: 9.1 mg/dL (ref 8.7–10.3)
Chloride: 103 mmol/L (ref 96–106)
Creatinine, Ser: 0.83 mg/dL (ref 0.57–1.00)
Globulin, Total: 2.3 g/dL (ref 1.5–4.5)
Glucose: 97 mg/dL (ref 70–99)
Potassium: 4.4 mmol/L (ref 3.5–5.2)
Sodium: 140 mmol/L (ref 134–144)
Total Protein: 6.4 g/dL (ref 6.0–8.5)
eGFR: 78 mL/min/{1.73_m2} (ref >59)

## 2023-05-17 LAB — LIPID PANEL
Chol/HDL Ratio: 2.8 ratio (ref 0.0–4.4)
Cholesterol, Total: 193 mg/dL (ref 100–199)
HDL: 68 mg/dL (ref >39)
LDL Chol Calc (NIH): 112 mg/dL — ABNORMAL HIGH (ref 0–99)
Triglycerides: 71 mg/dL (ref 0–149)
VLDL Cholesterol Cal: 13 mg/dL (ref 5–40)

## 2023-05-17 LAB — HEMOGLOBIN A1C
Est. average glucose Bld gHb Est-mCnc: 114 mg/dL
Hgb A1c MFr Bld: 5.6 % (ref 4.8–5.6)

## 2023-05-17 LAB — TSH: TSH: 2.04 u[IU]/mL (ref 0.450–4.500)

## 2023-05-21 ENCOUNTER — Encounter: Payer: Self-pay | Admitting: Family

## 2023-05-21 ENCOUNTER — Ambulatory Visit: Payer: Medicare Other | Admitting: Family

## 2023-05-21 VITALS — BP 106/62 | HR 82 | Ht 68.0 in | Wt 184.4 lb

## 2023-05-21 DIAGNOSIS — G2581 Restless legs syndrome: Secondary | ICD-10-CM

## 2023-05-21 DIAGNOSIS — M810 Age-related osteoporosis without current pathological fracture: Secondary | ICD-10-CM

## 2023-05-21 DIAGNOSIS — F5101 Primary insomnia: Secondary | ICD-10-CM | POA: Diagnosis not present

## 2023-05-21 DIAGNOSIS — Z0130 Encounter for examination of blood pressure without abnormal findings: Secondary | ICD-10-CM

## 2023-05-21 DIAGNOSIS — F419 Anxiety disorder, unspecified: Secondary | ICD-10-CM | POA: Diagnosis not present

## 2023-05-21 NOTE — Progress Notes (Unsigned)
Established Patient Office Visit  Subjective:  Patient ID: Megan Rollins, female    DOB: 1955-11-17  Age: 67 y.o. MRN: [id number]  Chief Complaint  Patient presents with   Follow-up    3 mo    HPI  No other concerns at this time.   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    Past Surgical History:  Procedure Laterality Date   ABDOMINAL HYSTERECTOMY  2005   total   APPENDECTOMY     BREAST LUMPECTOMY Left    chemo and radiation 2001   BREAST SURGERY Left 2001   lumpectomy with radiation therapy and chemo therapy   CATARACT EXTRACTION, BILATERAL     CESAREAN SECTION  1984   KIDNEY SURGERY Right    as a child   OOPHORECTOMY Bilateral 2005   SHOULDER ARTHROSCOPY WITH OPEN ROTATOR CUFF REPAIR Right 06/18/2019   Procedure: SHOULDER ARTHROSCOPIC ROTATOR CUFF REPAIR, DISTAL CLAVICLE EXCISION, SUBACHROMIAL DECOMPRESSION, INTRA-ARTICULAR DEBRIDEMENT;  Surgeon: Lyndle Herrlich, MD;  Location: [facility];  Service: Orthopedics;  Laterality: Right;   SHOULDER ARTHROSCOPY WITH ROTATOR CUFF REPAIR Right 03/21/2020   Procedure: SHOULDER ARTHROSCOPY WITH ROTATOR CUFF REPAIR;  Surgeon: Lyndle Herrlich, MD;  Location: [facility];  Service: Orthopedics;  Laterality: Right;   SHOULDER SURGERY  2008   TIBIA FRACTURE SURGERY  2014   rod placed    Social History   Socioeconomic History   Marital status: Divorced    Spouse name: Optometrist   Number of children: 2   Years of education: College   Highest education level: Not on file  Occupational History    Employer:  [employer]  Tobacco Use   Smoking status: Never   Smokeless tobacco: Never  Vaping Use   Vaping status: Never Used  Substance and Sexual Activity   Alcohol use: Yes    Comment: Seldom   Drug use: No   Sexual activity: Not on file  Other Topics Concern   Not on file  Social History Narrative   Lives with roommate in a one story home.  Has 2 children.     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   Social Determinants of Health   Financial Resource Strain: Low Risk  (02/14/2023)   Overall Financial Resource Strain (CARDIA)    Difficulty of Paying Living Expenses: Not very hard  Food Insecurity: No Food Insecurity (02/18/2023)   Hunger Vital Sign    Worried About Running Out of Food in the Last Year: Never true    Ran Out of Food in the Last Year: Never true  Transportation Needs: No Transportation Needs (02/14/2023)   PRAPARE - Administrator, Civil Service (Medical): No    Lack of Transportation (Non-Medical): No  Physical Activity: Insufficiently Active (02/14/2023)   Exercise Vital Sign    Days of Exercise per Week: 2 days    Minutes of Exercise per Session: 20 min  Stress: No Stress Concern Present (02/14/2023)   [place]  [employer] - Occupational Stress Questionnaire    Feeling of Stress : Only a little  Social Connections: Unknown (02/14/2023)   Social Connection and Isolation Panel [NHANES]    Frequency of Communication with Friends and Family: Twice a week    Frequency of Social Gatherings with Friends and Family: Once a week    Attends Religious Services: Not on Marketing executive or Organizations: Yes    Attends Engineer, structural: More than 4 times per year    Marital Status: Divorced  Catering manager Violence: Not on file    Family History  Problem Relation Age of Onset   Liver cancer Father    Alcohol abuse Father    Breast cancer Mother    Heart attack Mother    Heart  attack Brother    Narcolepsy Brother    Heart attack Brother    Alcohol abuse Sister    Hepatitis Sister    Healthy Daughter    Other Son     Allergies  Allergen Reactions   Clarithromycin Nausea Only   Penicillins Rash   Sulfa Antibiotics Rash    ROS     Objective:   BP 106/62 (BP Location: Left Arm, Patient Position: Sitting)   Pulse 82   Ht 5\' 8"  (1.727 m)   Wt 184 lb 6.4 oz (83.6 kg)   SpO2 97%   BMI 28.04 kg/m   Vitals:   05/21/23 0936  BP: 106/62  Pulse: 82  Height: 5\' 8"  (1.727 m)  Weight: 184 lb 6.4 oz (83.6 kg)  SpO2: 97%  BMI (Calculated): 28.04    Physical Exam   No results found for any visits on 05/21/23.  Recent Results (from the past 2160 hour(s))  Hemoglobin A1c     Status: None   Collection Time: 05/16/23  8:56 AM  Result Value Ref Range   Hgb A1c MFr Bld 5.6 4.8 - 5.6 %    Comment:          Prediabetes: 5.7 - 6.4          Diabetes: >6.4          Glycemic control for adults with diabetes: <7.0    Est. average glucose Bld gHb Est-mCnc 114 mg/dL  TSH     Status: None   Collection Time: 05/16/23  8:56 AM  Result Value Ref Range   TSH 2.040 0.450 - 4.500 uIU/mL  Lipid panel     Status: Abnormal   Collection Time: 05/16/23  8:56 AM  Result Value Ref Range   Cholesterol, Total 193 100 - 199 mg/dL   Triglycerides 71 0 - 149 mg/dL   HDL 68 >16 mg/dL   VLDL Cholesterol Cal 13 5 - 40 mg/dL   LDL Chol Calc (NIH) 109 (H) 0 - 99 mg/dL   Chol/HDL Ratio 2.8 0.0 - 4.4 ratio    Comment:                                   T. Chol/HDL Ratio                                             Men  Women  1/2 Avg.Risk  3.4    3.3                                   Avg.Risk  5.0    4.4                                2X Avg.Risk  9.6    7.1                                3X Avg.Risk 23.4   11.0   CBC With Diff/Platelet     Status: None   Collection Time: 05/16/23  8:56 AM  Result Value Ref Range   WBC 5.9 3.4 - 10.8 x10E3/uL    RBC 4.25 3.77 - 5.28 x10E6/uL   Hemoglobin 12.9 11.1 - 15.9 g/dL   Hematocrit 84.6 96.2 - 46.6 %   MCV 94 79 - 97 fL   MCH 30.4 26.6 - 33.0 pg   MCHC 32.4 31.5 - 35.7 g/dL   RDW 95.2 84.1 - 32.4 %   Platelets 198 150 - 450 x10E3/uL   Neutrophils 70 Not Estab. %   Lymphs 20 Not Estab. %   Monocytes 7 Not Estab. %   Eos 2 Not Estab. %   Basos 1 Not Estab. %   Neutrophils Absolute 4.2 1.4 - 7.0 x10E3/uL   Lymphocytes Absolute 1.2 0.7 - 3.1 x10E3/uL   Monocytes Absolute 0.4 0.1 - 0.9 x10E3/uL   EOS (ABSOLUTE) 0.1 0.0 - 0.4 x10E3/uL   Basophils Absolute 0.0 0.0 - 0.2 x10E3/uL   Immature Granulocytes 0 Not Estab. %   Immature Grans (Abs) 0.0 0.0 - 0.1 x10E3/uL  CMP14+EGFR     Status: None   Collection Time: 05/16/23  8:56 AM  Result Value Ref Range   Glucose 97 70 - 99 mg/dL   BUN 16 8 - 27 mg/dL   Creatinine, Ser 4.01 0.57 - 1.00 mg/dL   eGFR 78 >02 VO/ZDG/6.44   BUN/Creatinine Ratio 19 12 - 28   Sodium 140 134 - 144 mmol/L   Potassium 4.4 3.5 - 5.2 mmol/L   Chloride 103 96 - 106 mmol/L   CO2 21 20 - 29 mmol/L   Calcium 9.1 8.7 - 10.3 mg/dL   Total Protein 6.4 6.0 - 8.5 g/dL   Albumin 4.1 3.9 - 4.9 g/dL   Globulin, Total 2.3 1.5 - 4.5 g/dL   Bilirubin Total 0.5 0.0 - 1.2 mg/dL   Alkaline Phosphatase 103 44 - 121 IU/L   AST 19 0 - 40 IU/L   ALT 16 0 - 32 IU/L       Assessment & Plan:   Problem List Items Addressed This Visit   None   No follow-ups on file.   Total time spent: {AMA time spent:29001} minutes  Miki Kins, FNP  05/21/2023   This document may have been prepared by [facility] Voice Recognition software and as such may include unintentional dictation errors.

## 2023-06-04 ENCOUNTER — Ambulatory Visit
Admission: RE | Admit: 2023-06-04 | Discharge: 2023-06-04 | Disposition: A | Discharge status: Home / Self Care | Payer: Medicare Other | Source: Ambulatory Visit | Attending: Family | Admitting: Family

## 2023-06-04 DIAGNOSIS — Z1231 Encounter for screening mammogram for malignant neoplasm of breast: Secondary | ICD-10-CM

## 2023-06-07 ENCOUNTER — Other Ambulatory Visit: Payer: Self-pay | Admitting: Family

## 2023-06-07 DIAGNOSIS — R928 Other abnormal and inconclusive findings on diagnostic imaging of breast: Secondary | ICD-10-CM

## 2023-06-14 ENCOUNTER — Other Ambulatory Visit: Payer: Self-pay | Admitting: Family

## 2023-06-15 ENCOUNTER — Ambulatory Visit
Admission: RE | Admit: 2023-06-15 | Discharge: 2023-06-15 | Disposition: A | Discharge status: Home / Self Care | Payer: Medicare Other | Source: Ambulatory Visit | Attending: Family | Admitting: Family

## 2023-06-15 ENCOUNTER — Other Ambulatory Visit: Payer: Self-pay | Admitting: Family

## 2023-06-15 DIAGNOSIS — R928 Other abnormal and inconclusive findings on diagnostic imaging of breast: Secondary | ICD-10-CM

## 2023-06-15 DIAGNOSIS — R921 Mammographic calcification found on diagnostic imaging of breast: Secondary | ICD-10-CM

## 2023-06-18 ENCOUNTER — Ambulatory Visit
Admission: RE | Admit: 2023-06-18 | Discharge: 2023-06-18 | Disposition: A | Discharge status: Home / Self Care | Payer: Medicare Other | Source: Ambulatory Visit | Attending: Family | Admitting: Family

## 2023-06-18 DIAGNOSIS — R921 Mammographic calcification found on diagnostic imaging of breast: Secondary | ICD-10-CM

## 2023-06-18 HISTORY — PX: BREAST BIOPSY: SHX20

## 2023-06-19 LAB — SURGICAL PATHOLOGY

## 2023-07-01 ENCOUNTER — Ambulatory Visit: Payer: Self-pay | Admitting: Surgery

## 2023-07-01 DIAGNOSIS — C50911 Malignant neoplasm of unspecified site of right female breast: Secondary | ICD-10-CM

## 2023-07-01 NOTE — H&P (Signed)
Subjective   Chief Complaint: NEW BREAST CANCER   Previous breast surgeon - Dr. Leonie Man Previous oncologist - Dr. Mancel Bale  History of Present Illness: Megan Rollins is a 67 y.o. female who is seen today as an office consultation at the request of Dr. Corliss Skains for evaluation of NEW BREAST CANCER .   This is a 67 year old female with a remote history of left breast cancer in 2001 treated with lumpectomy followed by radiation, chemotherapy, and Tamoxifen x 5 years.  She recently underwent screening mammogram followed by diagnostic mammogram that revealed a broad area of calcifications in the central/ medial right breast up to 6.0 cm.  She underwent three stereotactic biopsies of the anterior/ posterior/ and medial margins of this area of calcifications.  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 The medial biopsy revealed the same findings The anterior biopsy revealed DCIS, high-grade with suspicion for invasion.   Prognostic panel - ER - 10% positive   PR - negative   Ki-67 - 20%   Her 2 pending  The patient has decided to proceed with a right mastectomy as well as a risk reducing left mastectomy  Review of Systems: A complete review of systems was obtained from the patient.  I have reviewed this information and discussed as appropriate with the patient.  See HPI as well for other ROS.  Review of Systems  Constitutional: Negative.   HENT: Negative.    Eyes: Negative.   Respiratory: Negative.    Cardiovascular: Negative.   Gastrointestinal: Negative.   Genitourinary: Negative.   Musculoskeletal: Negative.   Skin: Negative.   Neurological: Negative.   Endo/Heme/Allergies: Negative.   Psychiatric/Behavioral:  Positive for depression. The patient is nervous/anxious.       Medical History: Past Medical History:  Diagnosis Date   Anxiety    History of cancer     Patient Active Problem List  Diagnosis   Invasive ductal carcinoma  of breast, female, right (CMS/HHS-HCC)    Past Surgical History:  Procedure Laterality Date   Left Brest Lumpectomy Left 2001   COLONOSCOPY  12/27/2010   Adenomatous Polyps: [facility] 11/2013   COLONOSCOPY  11/30/2014   Sessile Serrated Adenoma: [facility] 10/2019   COLONOSCOPY  08/25/2018   Sessile Serrated Adenoma: [facility] 08/2021   Colon @ [facility]  03/21/2023   Tubular adenoma/PHx CP/Polyp was small TA. Repeat colonoscopy in 7 years/[facility]   ARTHROSCOPIC ROTATOR CUFF REPAIR     CESAREAN SECTION     HYSTERECTOMY     JOINT REPLACEMENT     Kidney surgery     Leg surgery       Allergies  Allergen Reactions   Clarithromycin Nausea   Penicillins Rash   Sulfacetamide Sodium Rash    Current Outpatient Medications on File Prior to Visit  Medication Sig Dispense Refill   alendronate (FOSAMAX) 70 MG tablet TAKE 1 TABLET EVERY 7 DAYS,WITH A FULL GLASS OF WATER ON AN EMPTY STOMACH     calcium phos-D3-magnesium-zinc 100 mg-25 mcg- 17 mg-1.67 mg Chew Take by mouth     citalopram (CELEXA) 40 MG tablet Take 40 mg by mouth at bedtime     clonazePAM (KLONOPIN) 2 MG tablet TAKE 1 TABLET BY MOUTH TWICE DAILY AS NEEDED FOR MOMENTS OF HIGH ANXIETY     gabapentin (NEURONTIN) 300 MG capsule TAKE 1 CAPSULE BY MOUTH NIGHTLY AT BEDTIME.     multivitamin tablet Take 1 tablet by mouth once daily  ezetimibe (ZETIA) 10 mg tablet Take 1 tablet by mouth every evening (Patient not taking: Reported on 07/01/2023)     fluticasone propionate (FLONASE) 50 mcg/actuation nasal spray INSTILL ONE SPRAY IN EACH NOSTRIL ONCE DAILY (Patient not taking: Reported on 07/01/2023)     sodium, potassium, and magnesium (SUPREP) oral solution Take 1 Bottle by mouth as directed One kit contains 2 bottles.  Take both bottles at the times instructed by your provider. (Patient not taking: Reported on 07/01/2023) 354 mL 0   No current facility-administered medications on file prior to visit.    Family History  Problem Relation Age of Onset    Coronary Artery Disease (Blocked arteries around heart) Mother    Breast cancer Mother      Social History   Tobacco Use  Smoking Status Former   Types: Cigarettes   Start date: 2019  Smokeless Tobacco Never     Social History   Socioeconomic History   Marital status: Divorced  Tobacco Use   Smoking status: Former    Types: Cigarettes    Start date: 2019   Smokeless tobacco: Never  Substance and Sexual Activity   Alcohol use: Never   Drug use: Never   Social Drivers of Corporate investment banker Strain: Low Risk  (02/14/2023)   Received from [facility]   Overall Financial Resource Strain (CARDIA)    Difficulty of Paying Living Expenses: Not very hard  Food Insecurity: No Food Insecurity (02/18/2023)   Received from [facility]   Hunger Vital Sign    Worried About Running Out of Food in the Last Year: Never true    Ran Out of Food in the Last Year: Never true  Transportation Needs: No Transportation Needs (02/14/2023)   Received from [facility] - Transportation    Lack of Transportation (Medical): No    Lack of Transportation (Non-Medical): No  Physical Activity: Insufficiently Active (02/14/2023)   Received from [facility]   Exercise Vital Sign    Days of Exercise per Week: 2 days    Minutes of Exercise per Session: 20 min  Stress: No Stress Concern Present (02/14/2023)   Received from [facility] - Occupational Stress Questionnaire    Feeling of Stress : Only a little  Social Connections: Unknown (02/14/2023)   Received from [facility]   Social Connection and Isolation Panel [NHANES]    Frequency of Communication with Friends and Family: Twice a week    Frequency of Social Gatherings with Friends and Family: Once a week    Active Member of Clubs or Organizations: Yes    Attends Engineer, structural: More than 4 times per year    Marital Status: Divorced    Objective:    Vitals:   07/01/23  1509 07/01/23 1510  BP: 102/64   Pulse: 90   Temp: 36.6 C (97.8 F)   SpO2: 97%   Weight: 82.3 kg (181 lb 6.4 oz)   Height: 172.7 cm (5\' 8" )   PainSc:    7  PainLoc:  Breast    Body mass index is 27.58 kg/m.  Physical Exam   Constitutional:  WDWN in NAD, conversant, no obvious deformities; lying in bed comfortably Eyes:  Pupils equal, round; sclera anicteric; moist conjunctiva; no lid lag HENT:  Oral mucosa moist; good dentition  Neck:  No masses palpated, trachea midline; no thyromegaly Lungs:  CTA bilaterally; normal respiratory effort Breasts:  symmetric, no  nipple changes; no palpable masses or lymphadenopathy on left side.  Healed lumpectomy incision. Right breast shows no lymphadenopathy or nipple changes.  Firm palpable hematoma around the three biopsy sites in the medial right breast CV:  Regular rate and rhythm; no murmurs; extremities well-perfused with no edema Abd:  +bowel sounds, soft, non-tender, no palpable organomegaly; no palpable hernias Musc:  Normal gait; no apparent clubbing or cyanosis in extremities Lymphatic:  No palpable cervical or axillary lymphadenopathy Skin:  Warm, dry; no sign of jaundice Psychiatric - alert and oriented x 4; calm mood and affect   Labs, Imaging and Diagnostic Testing: Patient Name: ENSLEY, HENNEBERGER Visit # : [id number]  MRN: [id number] Physician: Romona Curls DOB/Age 11/25/1955 (Age: 64) Gender: F Collected Date: 06/18/2023 Received Date: 06/18/2023  FINAL DIAGNOSIS       1. Breast, right, needle core biopsy,  :      INVASIVE DUCTAL CARCINOMA      DUCTAL CARCINOMA IN SITU, CRIBRIFORM AND SOLID TYPES WITH NECROSIS, HIGH NUCLEAR      GRADE      TUBULE FORMATION: SCORE 3      NUCLEAR PLEOMORPHISM: SCORE 3      MITOTIC COUNT: SCORE 1      TOTAL SCORE: 7      OVERALL GRADE: 2      LYMPHOVASCULAR INVASION: NOT IDENTIFIED      CANCER LENGTH: 5 MM      CALCIFICATIONS: PRESENT      OTHER FINDINGS: NONE      SEE NOTE        2. Breast, right, needle core biopsy,  :      INVASIVE DUCTAL CARCINOMA      DUCTAL CARCINOMA IN SITU, CRIBRIFORM AND SOLID TYPES WITH NECROSIS, HIGH NUCLEAR      GRADE      TUBULE FORMATION: SCORE 3      NUCLEAR PLEOMORPHISM: SCORE 3      MITOTIC COUNT: SCORE 1      TOTAL SCORE: 7      OVERALL GRADE: 2      LYMPHOVASCULAR INVASION: NOT IDENTIFIED      CANCER LENGTH: 1.8 MM      CALCIFICATIONS: PRESENT      OTHER FINDINGS: NONE       3. Breast, right, needle core biopsy,  :      DUCTAL CARCINOMA IN SITU, HIGH GRADE, SOLID AND PAPILLARY TYPES      SUSPICIOUS FOR INVASION (IMMUNOHISTOCHEMICAL STAINS WILL BE REPORTED IN AN      ADDENDUM)      NECROSIS: PRESENT      CALCIFICATIONS: PRESENT      DCIS LENGTH: 1.6 CM      SEE NOTE       Diagnosis Note : Dr.Picklesimer reviewed the case and concurs with the      interpretation.A breast prognostic profile (ER and PR) is pending and will be      reported in an addendum.The breast center of [place] [facility] was notified on      06/19/2023.   ADDENDUM Breast, right, needle core biospy PROGNOSTIC INDICATORS  Results: IMMUNOHISTOCHEMICAL AND MORPHOMETRIC ANALYSIS PERFORMED MANUALLY The tumor cells are equivocal for Her2 (2+). Her2 by FISH will be performed and the results reported separately. Estrogen Receptor:  10%, POSITIVE, MODERATE-STRONG STAINING INTENSITY. see comment. Progesterone Receptor:  0%, NEGATIVE Proliferation Marker Ki67:  20% COMMENT:  Small focus of carcinoma in situ is positive for ER, the invasive carcinoma  and the rest of DCIS  components are negative for ER. The negative hormone receptor study(ies) in this case has an internal positive control.   CLINICAL DATA:  Screening recall for right breast calcifications. The patient has history of a remote left breast lumpectomy in 2001.   EXAM: DIGITAL DIAGNOSTIC UNILATERAL RIGHT MAMMOGRAM   TECHNIQUE: Right digital diagnostic mammography was performed.    COMPARISON:  Previous exam(s).   ACR Breast Density Category b: There are scattered areas of fibroglandular density.   FINDINGS: Spot compression magnification images of the central to medial right breast demonstrates a broad area of loosely scattered calcifications, which in the craniocaudal orientation are in a triangular orientation, with the largest dimensions spanning 6 cm in the anterior to posterior orientation. In the medial to lateral orientation they span approximately 3.4 cm.   IMPRESSION: There is a broad span of calcifications in the central to medial aspect of the right breast spanning up to 6.0 cm.   RECOMMENDATION: A 3 sites stereotactic biopsy is recommended for the anterior, posterior and medial margin of the calcifications.   I have discussed the findings and recommendations with the patient. If applicable, a reminder letter will be sent to the patient regarding the next appointment.   BI-RADS CATEGORY  4: Suspicious.     Electronically Signed   By: Frederico Hamman M.D.   On: 06/15/2023 11:44  Assessment and Plan:  Diagnoses and all orders for this visit:  Invasive ductal carcinoma of breast, female, right (CMS/HHS-HCC)  After long discussion with the patient, she has opted for a right mastectomy with sentinel lymph node biopsy as well as a risk reducing left mastectomy.  She is interested in immediate reconstruction.  We will refer her to plastic surgery for evaluation.  She has already contacted Dr. Kalman Drape office and arrange oncology follow-up.  There does not appear to be a role for radiation therapy at this time.  The surgical procedure has been discussed with the patient.  Potential risks, benefits, alternative treatments, and expected outcomes have been explained.  All of the patient's questions at this time have been answered.  The likelihood of reaching the patient's treatment goal is good.  The patient understands the proposed surgical  procedure and wishes to proceed.   Siearra Amberg Delbert Harness, MD  07/01/2023 3:23 PM

## 2023-07-03 ENCOUNTER — Encounter: Payer: Self-pay | Admitting: Family

## 2023-07-03 DIAGNOSIS — M810 Age-related osteoporosis without current pathological fracture: Secondary | ICD-10-CM | POA: Insufficient documentation

## 2023-07-03 NOTE — Assessment & Plan Note (Signed)
 Dexa scan shows improvement since previous scan.  Will continue current poc and monitor going forward. Repeat scan in 1 year

## 2023-07-03 NOTE — Assessment & Plan Note (Signed)
 Patient stable.  Well controlled with current therapy.   Continue current meds.

## 2023-07-04 ENCOUNTER — Other Ambulatory Visit: Payer: Medicare PPO

## 2023-07-04 DIAGNOSIS — Z853 Personal history of malignant neoplasm of breast: Secondary | ICD-10-CM | POA: Insufficient documentation

## 2023-07-08 ENCOUNTER — Encounter: Payer: Self-pay | Admitting: *Deleted

## 2023-07-08 ENCOUNTER — Other Ambulatory Visit: Payer: Self-pay | Admitting: *Deleted

## 2023-07-08 DIAGNOSIS — N63 Unspecified lump in unspecified breast: Secondary | ICD-10-CM

## 2023-07-08 NOTE — Progress Notes (Signed)
 PATIENT NAVIGATOR PROGRESS NOTE  Name: Megan Rollins Date: 07/08/2023 MRN: [id number]  DOB: August 30, 1955   Reason for visit:  New patient appt  Comments:  Spoke with patient and she has met with surgeon and pathology results indicate she may need neoadjuvant treatment.  She is meeting with plastic surgeon today for further surgical planning  Scheduled to see Dr Cloretta on 07/11/23 at 1:40pm     Time spent counseling/coordinating care: 30-45 minutes

## 2023-07-11 ENCOUNTER — Encounter: Payer: Self-pay | Admitting: *Deleted

## 2023-07-11 ENCOUNTER — Inpatient Hospital Stay: Payer: Medicare PPO | Attending: Oncology | Admitting: Oncology

## 2023-07-11 VITALS — BP 107/78 | HR 86 | Temp 98.1°F | Resp 18 | Ht 68.0 in | Wt 181.0 lb

## 2023-07-11 DIAGNOSIS — C50811 Malignant neoplasm of overlapping sites of right female breast: Secondary | ICD-10-CM | POA: Insufficient documentation

## 2023-07-11 DIAGNOSIS — Z171 Estrogen receptor negative status [ER-]: Secondary | ICD-10-CM | POA: Diagnosis not present

## 2023-07-11 DIAGNOSIS — Z1731 Human epidermal growth factor receptor 2 positive status: Secondary | ICD-10-CM | POA: Insufficient documentation

## 2023-07-11 DIAGNOSIS — Z90710 Acquired absence of both cervix and uterus: Secondary | ICD-10-CM | POA: Insufficient documentation

## 2023-07-11 DIAGNOSIS — Z1722 Progesterone receptor negative status: Secondary | ICD-10-CM | POA: Insufficient documentation

## 2023-07-11 DIAGNOSIS — Z803 Family history of malignant neoplasm of breast: Secondary | ICD-10-CM | POA: Insufficient documentation

## 2023-07-11 DIAGNOSIS — Z90722 Acquired absence of ovaries, bilateral: Secondary | ICD-10-CM | POA: Insufficient documentation

## 2023-07-11 DIAGNOSIS — N63 Unspecified lump in unspecified breast: Secondary | ICD-10-CM

## 2023-07-11 DIAGNOSIS — Z853 Personal history of malignant neoplasm of breast: Secondary | ICD-10-CM | POA: Insufficient documentation

## 2023-07-11 DIAGNOSIS — Z923 Personal history of irradiation: Secondary | ICD-10-CM | POA: Insufficient documentation

## 2023-07-11 DIAGNOSIS — Z9221 Personal history of antineoplastic chemotherapy: Secondary | ICD-10-CM | POA: Insufficient documentation

## 2023-07-11 NOTE — Progress Notes (Signed)
 [facility] New Patient Consult   Requesting MD: Orlean Alan HERO, Fnp [address] [place],  [place] [address]   Megan Rollins 68 y.o.  Apr 07, 1956    Reason for Consult: Breast cancer   HPI: Megan Rollins has a history of stage I left-sided breast cancer diagnosed in 2001.  She was treated with a left lumpectomy, radiation, adjuvant AC chemotherapy, and 5 years of tamoxifen. She had a routine bilateral mammogram on 06/04/2023.  Calcifications were noted in the right breast and additional evaluation was recommended.  Diagnostic right mammogram on 06/15/2023 demonstrated a broad area of scattered calcifications spanning 6 cm in the anterior to posterior orientation and 3.4 cm from medial to lateral.  She underwent a stereotactic needle biopsy of the upper outer quadrant, upper inner quadrant, and anterior upper inner quadrant on 06/18/2023.  The pathology from the upper outer quadrant revealed invasive carcinoma with high-grade DCIS with the invasive component measuring 5 mm.  The upper inner quadrant biopsy revealed invasive ductal carcinoma with high-grade DCIS with an invasive component measuring 1.8 mm.  The anterior upper inner quadrant biopsy revealed DCIS suspicion for invasion.  Additional stains revealed no invasion at the anterior upper inner quadrant biopsy.  She saw Dr. Belinda and elected to proceed with bilateral mastectomy.  Her case was presented at the breast tumor conference this week and neoadjuvant chemotherapy was recommended due to the apparent tumor size and triple negative phenotype. The prognostic profile from the upper outer quadrant biopsy returned ER negative, PR negative, and HER2 2+.  HER2 was negative by FISH.  The Ki-67 returned at 20%.  Megan Rollins reports feeling completely well prior to the mammogram last month.  She reports no palpable change over either breast, no she has noted a burning sensation in both breast and rawness of the  nipples.  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    Past Surgical History:  Procedure Laterality Date   ABDOMINAL HYSTERECTOMY  2005   total   APPENDECTOMY     BREAST BIOPSY Right 06/18/2023   MM RT BREAST BX W LOC DEV 1ST LESION IMAGE BX SPEC STEREO GUIDE 06/18/2023 [facility]   BREAST BIOPSY Right 06/18/2023   MM RT BREAST BX W LOC DEV EA AD LESION IMG BX SPEC STEREO GUIDE 06/18/2023 [facility]   BREAST BIOPSY Right 06/18/2023   MM RT BREAST BX W LOC DEV EA AD LESION IMG BX SPEC STEREO GUIDE 06/18/2023 [facility]   BREAST LUMPECTOMY Left    chemo and radiation 2001   BREAST SURGERY Left 2001   lumpectomy with radiation therapy and chemo therapy   CATARACT EXTRACTION, BILATERAL     CESAREAN SECTION  1984   KIDNEY SURGERY Right    as a child   OOPHORECTOMY Bilateral 2005   SHOULDER ARTHROSCOPY WITH OPEN ROTATOR CUFF REPAIR Right 06/18/2019  Procedure: SHOULDER ARTHROSCOPIC ROTATOR CUFF REPAIR, DISTAL CLAVICLE EXCISION, SUBACHROMIAL DECOMPRESSION, INTRA-ARTICULAR DEBRIDEMENT;  Surgeon: Leora Lynwood SAUNDERS, MD;  [facility];  Service: Orthopedics;  Laterality: Right;   SHOULDER ARTHROSCOPY WITH ROTATOR CUFF REPAIR Right 03/21/2020   Procedure: SHOULDER ARTHROSCOPY WITH ROTATOR CUFF REPAIR;  Surgeon: Leora Lynwood SAUNDERS, MD;  Location: [facility];  Service: Orthopedics;  Laterality: Right;   SHOULDER  SURGERY  2008   TIBIA FRACTURE SURGERY  2014   rod placed    Medications: Reviewed  Allergies:  Allergies  Allergen Reactions   Clarithromycin Nausea Only   Penicillins Rash   Sulfa Antibiotics Rash    Family history: Her mother had breast cancer in her 54s  Social History:   She lives with a friend in [place].  She works as a LAWYER.  She does not use cigarettes.  Rare alcohol use.  No transfusion history.  No risk factor for HIV or hepatitis.  ROS:   Positives include: Burning sensation in both breast for the past week and a half with a raw feeling of the nipples  A complete ROS was otherwise negative.  Physical Exam:  Blood pressure 107/78, pulse 86, temperature 98.1 F (36.7 C), temperature source Temporal, resp. rate 18, height 5' 8 (1.727 m), weight 181 lb (82.1 kg), SpO2 98%.  HEENT: Oropharynx without visible mass, neck without mass Lungs: Clear bilaterally Cardiac: Regular rate and rhythm Abdomen: No hepatosplenomegaly, no mass, nontender Breast: Left lumpectomy with firm tissue surrounding the lumpectomy scar.  No mass in either breast.  Both axillae appear benign. Vascular: No leg edema Lymph nodes: No cervical, supraclavicular, axillary, or inguinal nodes Neurologic: Alert and oriented, the motor exam appears intact in the upper and lower extremities bilaterally Skin: No rash Musculoskeletal: No spine tenderness   LAB:  CBC  Lab Results  Component Value Date   WBC 5.9 05/16/2023   HGB 12.9 05/16/2023   HCT 39.8 05/16/2023   MCV 94 05/16/2023   PLT 198 05/16/2023   NEUTROABS 4.2 05/16/2023        CMP  Lab Results  Component Value Date   NA 140 05/16/2023   K 4.4 05/16/2023   CL 103 05/16/2023   CO2 21 05/16/2023   GLUCOSE 97 05/16/2023   BUN 16 05/16/2023   CREATININE 0.83 05/16/2023   CALCIUM 9.1 05/16/2023   PROT 6.4 05/16/2023   ALBUMIN 4.1 05/16/2023   AST 19 05/16/2023   ALT 16 05/16/2023   ALKPHOS 103 05/16/2023   BILITOT  0.5 05/16/2023   GFRNONAA >60 03/18/2020   GFRAA >60 03/18/2020      Assessment/Plan:   Invasive right breast cancer 06/04/2023: Screening mammogram-right breast calcifications Diagnostic right mammogram 06/15/2023: Calcifications in the central to medial right breast spanning 6 cm Stereotactic core biopsies of 3 sites in the right breast 06/18/2023: Posterior upper outer quadrant-invasive ductal carcinoma with high-grade DCIS, upper inner quadrant-invasive ductal carcinoma with high-grade DCIS, anterior upper inner quadrant-DCIS, upper outer quadrant invasive carcinoma ER negative, PR negative, HER2 2+ (HER2 negative by FISH) Left breast cancer 2001, stage I (T1cN0 M0), 1.2 m with 20-25% DCIS component, ER 83%, PR 94%, HER2 3+, Ki-67 15% Left lumpectomy, radiation, AC chemotherapy, and 5 years of tamoxifen Patient reports receiving 2 cycles of AC-discontinued secondary to toxicity 3.  Family history of breast cancer-mother 4.  Hysterectomy 2005 5.  History of osteoradionecrosis left anterior chest wall   Disposition:   Ms. Estrada has been diagnosed with invasive right-sided breast  cancer after calcifications spanning a large area were noted gram last month.  There is no palpable mass.  There is no clinical evidence of metastatic disease.  She has a remote history of left-sided breast cancer treated with a lumpectomy, radiation, adjuvant chemotherapy, and tamoxifen. I discussed treatment options with Ms. Monahan.  The tumor has a triple negative phenotype.  We discussed the indication for neoadjuvant systemic therapy including chemotherapy and immunotherapy.  I will refer her for a diagnostic bilateral breast MRI to better assess the tumor size and evaluate for lymphadenopathy.  We will refer her for staging CTs depending on the MRI findings.  She will return for an office visit and further discussion in 2 weeks.  She reports poor tolerance of AC chemotherapy and in 2001.  We will clarify  the number of cycles she received.  She will be referred to the genetics counselor.  She reports undergoing negative BRCA testing via her employer.  Arley Hof, MD  07/11/2023, 2:41 PM

## 2023-07-11 NOTE — Progress Notes (Signed)
 PATIENT NAVIGATOR PROGRESS NOTE  Name: Megan Rollins Date: 07/11/2023 MRN: [id number]  DOB: 15-Mar-1956   Reason for visit:  New patient appt  Comments:  Met with [name] during visit with Dr Cloretta  Order placed for MR bilateral breasts, scheduled for 07/23/23 at [facility] with arrival time of 3:30pm, pt given instructions Given Journeys notebook with disease specific information included Given contact number to call with any issues or questions RTC on 07/24/23 for scan review and discussion of treatment options    Time spent counseling/coordinating care: > 60 minutes

## 2023-07-12 ENCOUNTER — Other Ambulatory Visit: Payer: Self-pay | Admitting: *Deleted

## 2023-07-12 DIAGNOSIS — N63 Unspecified lump in unspecified breast: Secondary | ICD-10-CM

## 2023-07-12 NOTE — Progress Notes (Signed)
 Genetics referral entered per Dr Truett Perna

## 2023-07-15 ENCOUNTER — Telehealth: Payer: Self-pay | Admitting: Genetic Counselor

## 2023-07-15 NOTE — Telephone Encounter (Signed)
 Scheduled appointments per scheduling message. Patient is aware of the made appointments and will be mailed an appointment reminder.

## 2023-07-17 ENCOUNTER — Telehealth: Payer: Self-pay | Admitting: Genetic Counselor

## 2023-07-17 NOTE — Telephone Encounter (Signed)
 Rescheduled appointments per patients request. Patient is aware of the changes made and will be mailed an appointment reminder.

## 2023-07-19 ENCOUNTER — Other Ambulatory Visit: Payer: Self-pay

## 2023-07-19 MED ORDER — EZETIMIBE 10 MG PO TABS: 10.0000 mg | ORAL_TABLET | Freq: Every day | ORAL | 3 refills | Status: DC

## 2023-07-23 ENCOUNTER — Ambulatory Visit (HOSPITAL_COMMUNITY)
Admission: RE | Admit: 2023-07-23 | Discharge: 2023-07-23 | Disposition: A | Discharge status: Home / Self Care | Payer: Medicare PPO | Source: Ambulatory Visit | Attending: Oncology | Admitting: Oncology

## 2023-07-23 DIAGNOSIS — N63 Unspecified lump in unspecified breast: Secondary | ICD-10-CM | POA: Diagnosis present

## 2023-07-23 MED ORDER — GADOBUTROL 1 MMOL/ML IV SOLN
8.0000 mL | Freq: Once | INTRAVENOUS | Status: AC | PRN
  Administered 2023-07-23: 8 mL via INTRAVENOUS

## 2023-07-24 ENCOUNTER — Inpatient Hospital Stay: Payer: Medicare PPO | Admitting: Oncology

## 2023-07-24 VITALS — BP 107/64 | HR 79 | Temp 97.9°F | Resp 18 | Ht 68.0 in | Wt 185.5 lb

## 2023-07-24 DIAGNOSIS — Z171 Estrogen receptor negative status [ER-]: Secondary | ICD-10-CM

## 2023-07-24 DIAGNOSIS — Z803 Family history of malignant neoplasm of breast: Secondary | ICD-10-CM | POA: Diagnosis not present

## 2023-07-24 DIAGNOSIS — Z853 Personal history of malignant neoplasm of breast: Secondary | ICD-10-CM | POA: Diagnosis not present

## 2023-07-24 DIAGNOSIS — Z923 Personal history of irradiation: Secondary | ICD-10-CM | POA: Diagnosis not present

## 2023-07-24 DIAGNOSIS — Z90710 Acquired absence of both cervix and uterus: Secondary | ICD-10-CM | POA: Diagnosis not present

## 2023-07-24 DIAGNOSIS — C50811 Malignant neoplasm of overlapping sites of right female breast: Secondary | ICD-10-CM | POA: Diagnosis present

## 2023-07-24 DIAGNOSIS — Z1722 Progesterone receptor negative status: Secondary | ICD-10-CM | POA: Diagnosis not present

## 2023-07-24 DIAGNOSIS — Z9221 Personal history of antineoplastic chemotherapy: Secondary | ICD-10-CM | POA: Diagnosis not present

## 2023-07-24 DIAGNOSIS — C50111 Malignant neoplasm of central portion of right female breast: Secondary | ICD-10-CM | POA: Diagnosis not present

## 2023-07-24 DIAGNOSIS — Z90722 Acquired absence of ovaries, bilateral: Secondary | ICD-10-CM | POA: Diagnosis not present

## 2023-07-24 DIAGNOSIS — Z1731 Human epidermal growth factor receptor 2 positive status: Secondary | ICD-10-CM | POA: Diagnosis not present

## 2023-07-24 NOTE — Progress Notes (Unsigned)
  [facility] OFFICE PROGRESS NOTE   Diagnosis: Breast cancer  INTERVAL HISTORY: (Today's visit was completed over 2 days due to a delayed reading on the breast MRI)  Ms. Grandison returns as scheduled.  She reports discomfort in both breast.  No palpable change.  No other complaint.  Objective:  Vital signs in last 24 hours:  Blood pressure 107/64, pulse 79, temperature 97.9 F (36.6 C), temperature source Temporal, resp. rate 18, height 5\' 8"  (1.727 m), weight 185 lb 8 oz (84.1 kg), SpO2 96%.    Physical examination not performed today  Lab Results:  Lab Results  Component Value Date   WBC 5.9 05/16/2023   HGB 12.9 05/16/2023   HCT 39.8 05/16/2023   MCV 94 05/16/2023   PLT 198 05/16/2023   NEUTROABS 4.2 05/16/2023    CMP  Lab Results  Component Value Date   NA 140 05/16/2023   K 4.4 05/16/2023   CL 103 05/16/2023   CO2 21 05/16/2023   GLUCOSE 97 05/16/2023   BUN 16 05/16/2023   CREATININE 0.83 05/16/2023   CALCIUM 9.1 05/16/2023   PROT 6.4 05/16/2023   ALBUMIN 4.1 05/16/2023   AST 19 05/16/2023   ALT 16 05/16/2023   ALKPHOS 103 05/16/2023   BILITOT 0.5 05/16/2023   GFRNONAA >60 03/18/2020   GFRAA >60 03/18/2020     Medications: I have reviewed the patient's current medications.   Assessment/Plan: Invasive right breast cancer 06/04/2023: Screening mammogram-right breast calcifications Diagnostic right mammogram 06/15/2023: Calcifications in the central to medial right breast spanning 6 cm Stereotactic core biopsies of 3 sites in the right breast 06/18/2023: Posterior upper outer quadrant-invasive ductal carcinoma with high-grade DCIS, upper inner quadrant-invasive ductal carcinoma with high-grade DCIS, anterior upper inner quadrant-DCIS, upper outer quadrant invasive carcinoma ER negative, PR negative, HER2 2+ (HER2 negative by FISH) Breast MRI 07/23/2023: 3.9 x 1.2 x 1.2 cm area of enhancement in the central anterior to mid right breast, no  evidence of malignancy in the left breast, normal axillary lymph nodes, no internal mammary lymphadenopathy Left breast cancer 2001, stage I (T1cN0 M0), 1.2 m with 20-25% DCIS component, ER 83%, PR 94%, HER2 3+, Ki-67 15% Left lumpectomy, radiation, AC chemotherapy, and 5 years of tamoxifen Patient reports receiving 2 cycles of AC-discontinued secondary to toxicity 3.  Family history of breast cancer-mother 4.  Hysterectomy 2005 5.  History of osteoradionecrosis left anterior chest wall     Disposition: Ms. Leaders has been diagnosed with invasive right-sided breast cancer.  I discussed the MRI findings with her by telephone on 07/25/2023.  She has an area of non-mass like enhancement in the central right breast.  I suspect a mastectomy would be required if she were to have surgery now.  She is interested in breast preservation if possible.  I will discuss the case with Dr. Corliss Skains to be sure a lumpectomy is possible still the tumor size and lack of mass like enhancement.  I will recommend docetaxel, carboplatin, and pembrolizumab therapy if she decides to proceed with neoadjuvant chemotherapy.  She will return for an office visit and further discussion on 08/01/2023.  Thornton Papas, MD  07/24/2023  12:55 PM

## 2023-07-25 ENCOUNTER — Other Ambulatory Visit: Payer: Self-pay

## 2023-07-25 ENCOUNTER — Telehealth: Payer: Self-pay | Admitting: *Deleted

## 2023-07-25 DIAGNOSIS — C50919 Malignant neoplasm of unspecified site of unspecified female breast: Secondary | ICD-10-CM | POA: Insufficient documentation

## 2023-07-25 MED ORDER — CITALOPRAM HYDROBROMIDE 40 MG PO TABS: 40.0000 mg | ORAL_TABLET | Freq: Every day | ORAL | 3 refills | Status: DC

## 2023-07-25 MED ORDER — ALENDRONATE SODIUM 70 MG PO TABS: 70.0000 mg | ORAL_TABLET | ORAL | 3 refills | Status: AC

## 2023-07-25 MED ORDER — ALENDRONATE SODIUM 70 MG PO TABS: 70.0000 mg | ORAL_TABLET | ORAL | 3 refills | Status: DC

## 2023-07-26 ENCOUNTER — Telehealth: Payer: Self-pay | Admitting: *Deleted

## 2023-07-26 ENCOUNTER — Inpatient Hospital Stay: Payer: Medicare PPO

## 2023-07-26 ENCOUNTER — Inpatient Hospital Stay (HOSPITAL_BASED_OUTPATIENT_CLINIC_OR_DEPARTMENT_OTHER): Payer: Medicare PPO | Admitting: Genetic Counselor

## 2023-07-26 ENCOUNTER — Encounter: Payer: Self-pay | Admitting: Genetic Counselor

## 2023-07-26 DIAGNOSIS — Z8041 Family history of malignant neoplasm of ovary: Secondary | ICD-10-CM

## 2023-07-26 DIAGNOSIS — Z800 Family history of malignant neoplasm of digestive organs: Secondary | ICD-10-CM

## 2023-07-26 DIAGNOSIS — C50111 Malignant neoplasm of central portion of right female breast: Secondary | ICD-10-CM | POA: Diagnosis not present

## 2023-07-26 DIAGNOSIS — Z8042 Family history of malignant neoplasm of prostate: Secondary | ICD-10-CM

## 2023-07-26 DIAGNOSIS — Z171 Estrogen receptor negative status [ER-]: Secondary | ICD-10-CM

## 2023-07-26 DIAGNOSIS — Z853 Personal history of malignant neoplasm of breast: Secondary | ICD-10-CM | POA: Diagnosis not present

## 2023-07-26 DIAGNOSIS — Z803 Family history of malignant neoplasm of breast: Secondary | ICD-10-CM

## 2023-07-26 LAB — GENETIC SCREENING ORDER

## 2023-07-26 NOTE — Telephone Encounter (Signed)
Patient asking to be seen sooner than 1/30. Informed her he has no availability that this RN can see. Will need to f/u with MD In following up, noted that she had also called the nurse navigator and she was already working on this issue.

## 2023-07-26 NOTE — Telephone Encounter (Signed)
Pt called and requested earlier appt with Dr Truett Perna to discuss potential tx. Ms Meggison called and rescheduled to 1/27 from 1/30

## 2023-07-26 NOTE — Progress Notes (Signed)
REFERRING PROVIDER: Miki Kins, FNP [address] [place],  [place] [address]  PRIMARY PROVIDER:  Miki Kins, FNP  PRIMARY REASON FOR VISIT:  1. Malignant neoplasm of central portion of right breast in female, estrogen receptor negative (HCC)   2. History of breast cancer   3. Family history of malignant neoplasm of ovary   4. Family history of malignant neoplasm of gastrointestinal tract   5. Family history of malignant neoplasm of prostate   6. Family history of malignant neoplasm of breast      HISTORY OF PRESENT ILLNESS:   Megan Rollins, a 68 y.o. female, was seen for a [place] cancer genetics consultation at the request of Dr. Talbert Forest due to a personal and family history of cancer.  Megan Rollins presents to clinic today to discuss the possibility of a hereditary predisposition to cancer, genetic testing, and to further clarify her future cancer risks, as well as potential cancer risks for family members.   Megan Rollins, 68 y.o. female, was recently diagnosed with right triple negative breast cancer. She was diagnosed with left breast cancer at the age of 5 (reports ER+), treated with lumpectomy, radiation, chemo and tamoxifen.   She is currently finalizing treatment plan, leaning toward bilateral mastectomy.   She had genetic testing in 2015 with a 31 gene panel called "Preventest" offered by [facility]. No mutations identified.   CANCER HISTORY:  Oncology History  Breast cancer in female [facility])  07/25/2023 Initial Diagnosis   Breast cancer in female [facility])   07/25/2023 Cancer Staging   Staging form: Breast, AJCC 8th Edition - Clinical: cT2, cN0, cM0, ER-, PR-, HER2- - Signed by Ladene Artist, MD on 07/25/2023     RISK FACTORS:  Menarche was at age 52.  First live birth at age 78.  Ovaries intact: no.  Hysterectomy: no. TAH/BSO 2005 Menopausal status: postmenopausal.  HRT use: 0 years. Colonoscopy: yes; colonoscopy in 2024 detected one polyp, colonoscopy 2021  detected two polyps. Mammogram within the last year: yes. Number of breast biopsies: 3. Up to date with pelvic exams: yes. 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    Past Surgical History:  Procedure Laterality Date   ABDOMINAL HYSTERECTOMY  2005   total   APPENDECTOMY     BREAST BIOPSY Right 06/18/2023   MM RT BREAST BX W LOC DEV 1ST LESION IMAGE BX SPEC STEREO GUIDE 06/18/2023 [facility]   BREAST BIOPSY Right 06/18/2023   MM RT BREAST BX W LOC DEV EA AD LESION IMG BX SPEC STEREO GUIDE 06/18/2023 [facility]   BREAST BIOPSY Right 06/18/2023   MM RT BREAST BX W LOC DEV EA AD LESION IMG BX SPEC STEREO GUIDE 06/18/2023 [facility]   BREAST LUMPECTOMY Left    chemo and radiation 2001   BREAST SURGERY Left 2001   lumpectomy with radiation therapy and chemo therapy   CATARACT EXTRACTION, BILATERAL     CESAREAN SECTION  1984  KIDNEY SURGERY Right    as a child   OOPHORECTOMY Bilateral 2005   SHOULDER ARTHROSCOPY WITH OPEN ROTATOR CUFF REPAIR Right 06/18/2019   Procedure: SHOULDER ARTHROSCOPIC ROTATOR CUFF REPAIR, DISTAL CLAVICLE EXCISION, SUBACHROMIAL DECOMPRESSION, INTRA-ARTICULAR DEBRIDEMENT;  Surgeon: Lyndle Herrlich, MD;  Location: [facility];  Service: Orthopedics;  Laterality: Right;   SHOULDER ARTHROSCOPY WITH ROTATOR CUFF REPAIR Right 03/21/2020   Procedure: SHOULDER ARTHROSCOPY WITH ROTATOR CUFF REPAIR;  Surgeon: Lyndle Herrlich, MD;  Location: [facility];  Service: Orthopedics;   Laterality: Right;   SHOULDER SURGERY  2008   TIBIA FRACTURE SURGERY  2014   rod placed    Social History   Socioeconomic History   Marital status: Divorced    Spouse name: Optometrist   Number of children: 2   Years of education: College   Highest education level: Not on file  Occupational History    Employer: [employer]  Tobacco Use   Smoking status: Never   Smokeless tobacco: Never  Vaping Use   Vaping status: Never Used  Substance and Sexual Activity   Alcohol use: Yes    Comment: Seldom   Drug use: No   Sexual activity: Not on file  Other Topics Concern   Not on file  Social History Narrative   Lives with roommate in a one story home.  Has 2 children.     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   Social Drivers of Corporate investment banker Strain: Low Risk  (02/14/2023)   Overall Financial Resource Strain (CARDIA)    Difficulty of Paying Living Expenses: Not very hard  Food Insecurity: No Food Insecurity (07/11/2023)   Hunger Vital Sign    Worried About Running Out of Food in the Last Year: Never true    Ran Out of Food in the Last Year: Never true  Transportation Needs: No Transportation Needs (07/11/2023)   PRAPARE - Administrator, Civil Service (Medical): No    Lack of Transportation (Non-Medical): No  Physical Activity: Insufficiently Active (02/14/2023)   Exercise Vital Sign    Days of Exercise per Week: 2 days    Minutes of Exercise per Session: 20 min  Stress: No Stress Concern Present (02/14/2023)   [employer] of Occupational Health - Occupational Stress Questionnaire    Feeling of Stress : Only a little  Social Connections: Unknown (02/14/2023)   Social Connection and Isolation Panel [NHANES]    Frequency of Communication with Friends and Family: Twice a week    Frequency of Social Gatherings with Friends and Family: Once a week    Attends Religious Services: Not on Marketing executive or  Organizations: Yes    Attends Engineer, structural: More than 4 times per year    Marital Status: Divorced     FAMILY HISTORY:  We obtained a detailed, 4-generation family history.  Significant diagnoses are listed below: Family History  Problem Relation Age of Onset   Breast cancer Mother 69 - 36   Heart attack Mother    Liver cancer Father 63 - 21   Alcohol abuse Father    Alcohol abuse Sister    Hepatitis Sister    Ovarian cancer Sister 39 - 44   Heart attack Brother    Narcolepsy Brother    Heart attack Brother    Prostate cancer Maternal Uncle 60 - 69       metastatic  Prostate cancer Maternal Uncle 70   Prostate cancer Maternal Uncle 50       metastatic   Kidney cancer Maternal Uncle    Pancreatic cancer Maternal Grandmother 5   Pancreatic cancer Paternal Grandfather 69   Healthy Daughter    Other Son    Stomach cancer Niece 41 - 15    Megan Rollins is unaware of previous family history of genetic testing for hereditary cancer risks. There no reported Ashkenazi Jewish ancestry. There no known consanguinity.  Megan Rollins reports her sister was diagnosed with ovarian cancer in her 52s, passed away from unrelated causes in her 69s. Her sister's daughter was diagnosed with metastatic stomach cancer in her 30s, deceased. She reports her brother was exposed to agent orange, his daughter was diagnosed with a brain tumor her 37s and his son was born with a congential heart defect and passed away around 54 months of age. Megan Rollins mother was diagnosed with breast cancer at age 40, passed away from unrelated causes at age 44. She reports three maternal uncles diagnosed with prostate cancer and one maternal uncle diagnosed with kidney cancer. She reports a maternal first cousin diagnosed with breast cancer in her 65s. She reports her maternal grandmother diagnosed with pancreatic cancer at age 82. She reports her father was diagnosed with liver cancer around age 39, history of  alcohol abuse. She reports her paternal uncle was diagnosed with an unknown cancer, deceased. She reports her paternal grandfather was diagnosed with pancreatic cancer at age 59, deceased.     GENETIC COUNSELING ASSESSMENT: Megan Rollins is a 68 y.o. female with a personal and family history of cancer which is somewhat suggestive of an inherited cancer syndrome and predisposition to cancer. We, therefore, discussed and recommended the following at today's visit.   DISCUSSION: We discussed that, in general, most cancer is not inherited in families, but instead is sporadic or familial. Sporadic cancers occur by chance and typically happen at older ages (>50 years) as this type of cancer is caused by genetic changes acquired during an individual's lifetime. Some families have more cancers than would be expected by chance; however, the ages or types of cancer are not consistent with a known genetic mutation or known genetic mutations have been ruled out. This type of familial cancer is thought to be due to a combination of multiple genetic, environmental, hormonal, and lifestyle factors. While this combination of factors likely increases the risk of cancer, the exact source of this risk is not currently identifiable or testable.  We discussed that 5 - 10% of cancer is hereditary. We discussed that testing is beneficial for several reasons including knowing how to follow individuals after completing their treatment, identifying whether potential treatment options such as PARP inhibitors would be beneficial, and understand if other family members could be at risk for cancer and allow them to undergo genetic testing.   We reviewed the characteristics, features and inheritance patterns of hereditary cancer syndromes. We also discussed genetic testing, including the appropriate family members to test, the process of testing, insurance coverage and turn-around-time for results. We discussed the implications of a  negative, positive, carrier and/or variant of uncertain significant result. Megan Rollins  was offered a common hereditary cancer panel (36+ genes) and an expanded pan-cancer panel (70+ genes). Megan Rollins was informed of the benefits and limitations of each panel, including that expanded pan-cancer panels contain genes that do not have clear management guidelines at this point in time.  We also  discussed that as the number of genes included on a panel increases, the chances of variants of uncertain significance increases. Megan Rollins decided to pursue genetic testing for [facility] BRCAPlus and CancerNext-Expanded +RNAinsight gene panels. Testing includes the analysis of the following: sequencing, rearrangement, and RNA analysis for the following 76 genes: AIP, ALK, APC, ATM, AXIN2, BAP1, BARD1, BMPR1A, BRCA1, BRCA2, BRIP1, CDC73, CDH1, CDK4, CDKN1B, CDKN2A, CEBPA, CHEK2, CTNNA1, DDX41, DICER1, ETV6, FH, FLCN, GATA2, LZTR1, MAX, MBD4, MEN1, MET, MLH1, MSH2, MSH3, MSH6, MUTYH, NF1, NF2, NTHL1, PALB2, PHOX2B, PMS2, POT1, PRKAR1A, PTCH1, PTEN, RAD51C, RAD51D, RB1, RET, RUNX1, SDHA, SDHAF2, SDHB, SDHC, SDHD, SMAD4, SMARCA4, SMARCB1, SMARCE1, STK11, SUFU, TMEM127, TP53, TSC1, TSC2, VHL, and WT1 (sequencing and deletion/duplication); EGFR, HOXB13, KIT, MITF, PDGFRA, POLD1, and POLE (sequencing only); EPCAM and GREM1 (deletion/duplication only).    Based on Megan Rollins's personal and family history of cancer, she meets medical criteria for genetic testing. Despite that she meets criteria, she may still have an out of pocket cost. We discussed that if her out of pocket cost for testing is over $100, the laboratory will call and confirm whether she wants to proceed with testing.  If the out of pocket cost of testing is less than $100 she will be billed by the genetic testing laboratory.   We discussed that some people do not want to undergo genetic testing due to fear of genetic discrimination.  The Genetic Information  Nondiscrimination Act (GINA) was signed into federal law in 2008. GINA prohibits health insurers and most employers from discriminating against individuals based on genetic information (including the results of genetic tests and family history information). According to GINA, health insurance companies cannot consider genetic information to be a preexisting condition, nor can they use it to make decisions regarding coverage or rates. GINA also makes it illegal for most employers to use genetic information in making decisions about hiring, firing, promotion, or terms of employment. It is important to note that GINA does not offer protections for life insurance, disability insurance, or long-term care insurance. GINA does not apply to those in the [employer], those who work for companies with less than 15 employees, and new life insurance or long-term disability insurance policies.  Health status due to a cancer diagnosis is not protected under GINA. More information about GINA can be found by visiting [employer].  We reviewed the characteristics, features and inheritance patterns of hereditary cancer syndromes. We also discussed genetic testing, including the appropriate family members to test, the process of testing, insurance coverage and turn-around-time for results. We discussed the implications of a negative, positive and/or variant of uncertain significant result. In order to get genetic test results in a timely manner so that Megan Rollins can use these genetic test results for surgical decisions, we recommended Megan Rollins pursue genetic testing for the  [facility] panel. Once complete, we recommend Megan Rollins pursue reflex genetic testing to the CancerNext-Expanded +RNAinsight 76 gene panel.   Based on Megan Rollins's personal and family history of cancer, she meets medical criteria for genetic testing. Despite that she meets criteria, she may still have an out of pocket cost.   PLAN: After considering the  risks, benefits, and limitations, Megan Rollins provided informed consent to pursue genetic testing and the blood sample was sent to [facility] for analysis of the BRCAPlus with reflex to CancerNext-Expanded +RNAinsight panel. Results for the [facility] panel should be available within approximately 7-10 days, with the full panel available in approximately 2-3 weeks' time, at  which point they will be disclosed by telephone to Megan Rollins, as will any additional recommendations warranted by these results. Megan Rollins. Sofranko will receive a summary of her genetic counseling visit and a copy of her results once available. This information will also be available in Epic.   Lastly, we encouraged Megan Rollins. Nygren to remain in contact with cancer genetics annually so that we can continuously update the family history and inform her of any changes in cancer genetics and testing that may be of benefit for this family.   Megan Rollins. Rask questions were answered to her satisfaction today. Our contact information was provided should additional questions or concerns arise. Thank you for the referral and allowing [place] to share in the care of your patient.   Vassie Moment, Megan Rollins, [facility] Licensed, Retail [email or web address] phone: [phone]  60 minutes were spent on the date of the encounter in service to the patient including preparation, face-to-face consultation, documentation and care coordination.   The patient was seen alone. Drs. Meliton Rattan, and/or [place] were available for questions, if needed..    _______________________________________________________________________ For Office Staff:  Number of people involved in session: 1 Was an Intern/ student involved with case: no

## 2023-07-29 ENCOUNTER — Inpatient Hospital Stay: Payer: Medicare PPO | Admitting: Nurse Practitioner

## 2023-07-29 ENCOUNTER — Encounter: Payer: Self-pay | Admitting: Nurse Practitioner

## 2023-07-29 ENCOUNTER — Inpatient Hospital Stay: Payer: Medicare PPO

## 2023-07-29 VITALS — BP 121/82 | HR 85 | Temp 97.9°F | Resp 18 | Ht 68.0 in | Wt 182.6 lb

## 2023-07-29 DIAGNOSIS — Z171 Estrogen receptor negative status [ER-]: Secondary | ICD-10-CM | POA: Diagnosis not present

## 2023-07-29 DIAGNOSIS — C50811 Malignant neoplasm of overlapping sites of right female breast: Secondary | ICD-10-CM | POA: Diagnosis not present

## 2023-07-29 DIAGNOSIS — C50111 Malignant neoplasm of central portion of right female breast: Secondary | ICD-10-CM

## 2023-07-29 NOTE — Progress Notes (Unsigned)
   [facility] OFFICE PROGRESS NOTE   Diagnosis: Breast cancer   INTERVAL HISTORY:   Megan Rollins returns for follow-up.  She continues to note mild bilateral breast discomfort.  Otherwise feels well.  No nausea.  Bowels moving regularly.  She has decided to proceed with bilateral mastectomy and reconstruction.  Objective:  Vital signs in last 24 hours:  Blood pressure 121/82, pulse 85, temperature 97.9 F (36.6 C), temperature source Temporal, resp. rate 18, height 5\' 8"  (1.727 m), weight 182 lb 9.6 oz (82.8 kg), SpO2 96%.    Lymphatics: No palpable cervical or supraclavicular lymph nodes. Resp: Lungs clear bilaterally Cardio: Regular rate and rhythm. GI: No hepatosplenomegaly. Vascular: No leg edema.  Lab Results:  Lab Results  Component Value Date   WBC 5.9 05/16/2023   HGB 12.9 05/16/2023   HCT 39.8 05/16/2023   MCV 94 05/16/2023   PLT 198 05/16/2023   NEUTROABS 4.2 05/16/2023    Imaging:  No results found.  Medications: I have reviewed the patient's current medications.  Assessment/Plan: Invasive right breast cancer 06/04/2023: Screening mammogram-right breast calcifications Diagnostic right mammogram 06/15/2023: Calcifications in the central to medial right breast spanning 6 cm Stereotactic core biopsies of 3 sites in the right breast 06/18/2023: Posterior upper outer quadrant-invasive ductal carcinoma with high-grade DCIS, upper inner quadrant-invasive ductal carcinoma with high-grade DCIS, anterior upper inner quadrant-DCIS, upper outer quadrant invasive carcinoma ER negative, PR negative, HER2 2+ (HER2 negative by FISH) Breast MRI 07/23/2023: 3.9 x 1.2 x 1.2 cm area of enhancement in the central anterior to mid right breast, no evidence of malignancy in the left breast, normal axillary lymph nodes, no internal mammary lymphadenopathy Left breast cancer 2001, stage I (T1cN0 M0), 1.2 m with 20-25% DCIS component, ER 83%, PR 94%, HER2 3+, Ki-67  15% Left lumpectomy, radiation, AC chemotherapy, and 5 years of tamoxifen Patient reports receiving 2 cycles of AC-discontinued secondary to toxicity 3.  Family history of breast cancer-mother 4.  Hysterectomy 2005 5.  History of osteoradionecrosis left anterior chest wall  Disposition: [name] appears stable.  She has decided to undergo bilateral mastectomy with reconstruction.  She has questions regarding reconstruction options.  She will contact Dr. Corliss Skains to discuss further.  She understands chemotherapy may be recommended in the adjuvant setting pending final pathology.  At present she is scheduled for bilateral mastectomy 08/06/2023.  We will arrange for a follow-up visit here about 2 weeks following surgery to review the pathology report with her.  Patient seen with Dr. Truett Perna.    Lonna Cobb ANP/GNP-BC   07/29/2023  1:23 PM  This was a shared visit with Lonna Cobb.  We reviewed the MRI findings and images with Megan Rollins.  She has been diagnosed with invasive breast cancer involving the central right breast.  I recommend neoadjuvant docetaxel/carboplatin/pembrolizumab.  She understands it is possible she we will be able to have breast preserving surgery following neoadjuvant therapy.  [name] has decided against neoadjuvant therapy.  She would like to proceed with bilateral mastectomy and TRAM reconstructions.  I contacted Dr. Corliss Skains.  He will call her to discuss the planned surgery.  [name] understands we will most likely recommend adjuvant chemotherapy.  We will review the final pathology including the axillary lymph node status and decide on adjuvant therapy.  I was present for greater than 50% of today's visit.  I performed medical decision making.  Mancel Bale, MD

## 2023-07-30 NOTE — Telephone Encounter (Signed)
Phone call

## 2023-07-31 ENCOUNTER — Other Ambulatory Visit: Payer: Self-pay

## 2023-07-31 ENCOUNTER — Encounter (HOSPITAL_BASED_OUTPATIENT_CLINIC_OR_DEPARTMENT_OTHER): Payer: Self-pay | Admitting: Surgery

## 2023-08-01 ENCOUNTER — Other Ambulatory Visit: Payer: Medicare PPO

## 2023-08-01 ENCOUNTER — Ambulatory Visit: Payer: Medicare PPO | Admitting: Oncology

## 2023-08-05 MED ORDER — CHLORHEXIDINE GLUCONATE CLOTH 2 % EX PADS: 6.0000 | MEDICATED_PAD | Freq: Once | CUTANEOUS | Status: DC

## 2023-08-05 NOTE — Progress Notes (Signed)

## 2023-08-06 ENCOUNTER — Encounter (HOSPITAL_BASED_OUTPATIENT_CLINIC_OR_DEPARTMENT_OTHER): Payer: Self-pay | Admitting: Surgery

## 2023-08-06 ENCOUNTER — Ambulatory Visit (HOSPITAL_BASED_OUTPATIENT_CLINIC_OR_DEPARTMENT_OTHER)
Admission: RE | Admit: 2023-08-06 | Discharge: 2023-08-07 | Disposition: A | Discharge status: Home / Self Care | Payer: Medicare PPO | Attending: Surgery | Admitting: Surgery

## 2023-08-06 ENCOUNTER — Ambulatory Visit (HOSPITAL_BASED_OUTPATIENT_CLINIC_OR_DEPARTMENT_OTHER): Payer: Medicare PPO | Admitting: Anesthesiology

## 2023-08-06 ENCOUNTER — Encounter (HOSPITAL_BASED_OUTPATIENT_CLINIC_OR_DEPARTMENT_OTHER)
Admission: RE | Disposition: A | Discharge status: Home / Self Care | Payer: Self-pay | Source: Home / Self Care | Attending: Surgery

## 2023-08-06 ENCOUNTER — Other Ambulatory Visit: Payer: Self-pay

## 2023-08-06 DIAGNOSIS — N6489 Other specified disorders of breast: Secondary | ICD-10-CM | POA: Insufficient documentation

## 2023-08-06 DIAGNOSIS — C50111 Malignant neoplasm of central portion of right female breast: Secondary | ICD-10-CM | POA: Insufficient documentation

## 2023-08-06 DIAGNOSIS — F418 Other specified anxiety disorders: Secondary | ICD-10-CM

## 2023-08-06 DIAGNOSIS — Z1722 Progesterone receptor negative status: Secondary | ICD-10-CM | POA: Diagnosis not present

## 2023-08-06 DIAGNOSIS — Z9221 Personal history of antineoplastic chemotherapy: Secondary | ICD-10-CM | POA: Insufficient documentation

## 2023-08-06 DIAGNOSIS — Z853 Personal history of malignant neoplasm of breast: Secondary | ICD-10-CM | POA: Insufficient documentation

## 2023-08-06 DIAGNOSIS — Z01818 Encounter for other preprocedural examination: Secondary | ICD-10-CM

## 2023-08-06 DIAGNOSIS — F419 Anxiety disorder, unspecified: Secondary | ICD-10-CM | POA: Diagnosis not present

## 2023-08-06 DIAGNOSIS — G8918 Other acute postprocedural pain: Secondary | ICD-10-CM | POA: Diagnosis not present

## 2023-08-06 DIAGNOSIS — Z4001 Encounter for prophylactic removal of breast: Secondary | ICD-10-CM | POA: Diagnosis not present

## 2023-08-06 DIAGNOSIS — Z170 Estrogen receptor positive status [ER+]: Secondary | ICD-10-CM | POA: Diagnosis not present

## 2023-08-06 DIAGNOSIS — Z1732 Human epidermal growth factor receptor 2 negative status: Secondary | ICD-10-CM | POA: Diagnosis not present

## 2023-08-06 DIAGNOSIS — D0511 Intraductal carcinoma in situ of right breast: Secondary | ICD-10-CM

## 2023-08-06 DIAGNOSIS — F32A Depression, unspecified: Secondary | ICD-10-CM | POA: Diagnosis not present

## 2023-08-06 DIAGNOSIS — Z87891 Personal history of nicotine dependence: Secondary | ICD-10-CM | POA: Insufficient documentation

## 2023-08-06 DIAGNOSIS — C50911 Malignant neoplasm of unspecified site of right female breast: Secondary | ICD-10-CM | POA: Diagnosis not present

## 2023-08-06 DIAGNOSIS — Z923 Personal history of irradiation: Secondary | ICD-10-CM | POA: Insufficient documentation

## 2023-08-06 HISTORY — DX: Anxiety disorder, unspecified: F41.9

## 2023-08-06 HISTORY — PX: SIMPLE MASTECTOMY WITH AXILLARY SENTINEL NODE BIOPSY: SHX6098

## 2023-08-06 HISTORY — PX: MASTECTOMY W/ SENTINEL NODE BIOPSY: SHX2001

## 2023-08-06 SURGERY — MASTECTOMY WITH SENTINEL LYMPH NODE BIOPSY
Anesthesia: Regional | Site: Breast | Laterality: Right

## 2023-08-06 MED ORDER — PHENYLEPHRINE HCL (PRESSORS) 10 MG/ML IV SOLN
INTRAVENOUS | Status: DC | PRN
  Administered 2023-08-06 (×2): 80 ug via INTRAVENOUS
  Administered 2023-08-06: 160 ug via INTRAVENOUS
  Administered 2023-08-06: 80 ug via INTRAVENOUS
  Administered 2023-08-06: 160 ug via INTRAVENOUS
  Administered 2023-08-06 (×2): 80 ug via INTRAVENOUS

## 2023-08-06 MED ORDER — KETAMINE HCL 50 MG/5ML IJ SOSY
PREFILLED_SYRINGE | INTRAMUSCULAR | Status: AC
  Filled 2023-08-06: qty 5

## 2023-08-06 MED ORDER — HYDROMORPHONE HCL 1 MG/ML IJ SOLN
0.5000 mg | INTRAMUSCULAR | Status: DC | PRN
Start: 2023-08-06 — End: 2023-08-07
  Administered 2023-08-06: 0.5 mg via INTRAVENOUS

## 2023-08-06 MED ORDER — MIDAZOLAM HCL 2 MG/2ML IJ SOLN
2.0000 mg | Freq: Once | INTRAMUSCULAR | Status: AC
  Administered 2023-08-06: 2 mg via INTRAVENOUS

## 2023-08-06 MED ORDER — DEXAMETHASONE SODIUM PHOSPHATE 10 MG/ML IJ SOLN
INTRAMUSCULAR | Status: AC
  Filled 2023-08-06: qty 1

## 2023-08-06 MED ORDER — GABAPENTIN 300 MG PO CAPS
300.0000 mg | ORAL_CAPSULE | Freq: Every day | ORAL | Status: DC
Start: 2023-08-06 — End: 2023-08-07
  Administered 2023-08-06: 300 mg via ORAL
  Filled 2023-08-06: qty 1

## 2023-08-06 MED ORDER — FENTANYL CITRATE (PF) 100 MCG/2ML IJ SOLN
INTRAMUSCULAR | Status: AC
  Filled 2023-08-06: qty 2

## 2023-08-06 MED ORDER — LORATADINE 10 MG PO TABS: 10.0000 mg | ORAL_TABLET | Freq: Every day | ORAL | Status: DC

## 2023-08-06 MED ORDER — BUPIVACAINE LIPOSOME 1.3 % IJ SUSP
INTRAMUSCULAR | Status: DC | PRN
  Administered 2023-08-06 (×2): 10 mL via PERINEURAL

## 2023-08-06 MED ORDER — ACETAMINOPHEN 500 MG PO TABS: 1000.0000 mg | ORAL_TABLET | ORAL | Status: DC

## 2023-08-06 MED ORDER — HYDROMORPHONE HCL 1 MG/ML IJ SOLN
INTRAMUSCULAR | Status: AC
  Filled 2023-08-06: qty 0.5

## 2023-08-06 MED ORDER — ACETAMINOPHEN 500 MG PO TABS
1000.0000 mg | ORAL_TABLET | Freq: Once | ORAL | Status: AC
  Administered 2023-08-06: 1000 mg via ORAL

## 2023-08-06 MED ORDER — TRANEXAMIC ACID 1000 MG/10ML IV SOLN
Status: DC | PRN
  Administered 2023-08-06: 3000 mg via TOPICAL

## 2023-08-06 MED ORDER — TRAMADOL HCL 50 MG PO TABS: 50.0000 mg | ORAL_TABLET | Freq: Four times a day (QID) | ORAL | Status: DC | PRN

## 2023-08-06 MED ORDER — MORPHINE SULFATE (PF) 4 MG/ML IV SOLN: 4.0000 mg | INTRAVENOUS | Status: DC | PRN

## 2023-08-06 MED ORDER — BUPIVACAINE-EPINEPHRINE (PF) 0.25% -1:200000 IJ SOLN
INTRAMUSCULAR | Status: AC
  Filled 2023-08-06: qty 30

## 2023-08-06 MED ORDER — EPHEDRINE SULFATE (PRESSORS) 50 MG/ML IJ SOLN
INTRAMUSCULAR | Status: DC | PRN
  Administered 2023-08-06 (×3): 10 mg via INTRAVENOUS
  Administered 2023-08-06: 5 mg via INTRAVENOUS

## 2023-08-06 MED ORDER — OXYCODONE HCL 5 MG PO TABS
5.0000 mg | ORAL_TABLET | ORAL | Status: DC | PRN
  Administered 2023-08-06 (×2): 5 mg via ORAL
  Filled 2023-08-06 (×2): qty 1

## 2023-08-06 MED ORDER — CEFAZOLIN SODIUM-DEXTROSE 2-4 GM/100ML-% IV SOLN
2.0000 g | INTRAVENOUS | Status: AC
  Administered 2023-08-06: 2 g via INTRAVENOUS

## 2023-08-06 MED ORDER — FENTANYL CITRATE (PF) 100 MCG/2ML IJ SOLN
INTRAMUSCULAR | Status: DC | PRN
  Administered 2023-08-06 (×2): 25 ug via INTRAVENOUS

## 2023-08-06 MED ORDER — ONDANSETRON HCL 4 MG/2ML IJ SOLN
INTRAMUSCULAR | Status: DC | PRN
  Administered 2023-08-06: 4 mg via INTRAVENOUS

## 2023-08-06 MED ORDER — ACETAMINOPHEN 325 MG RE SUPP: 650.0000 mg | Freq: Four times a day (QID) | RECTAL | Status: DC | PRN

## 2023-08-06 MED ORDER — MAGTRACE LYMPHATIC TRACER
INTRAMUSCULAR | Status: DC | PRN
  Administered 2023-08-06: 2 mL via INTRAMUSCULAR

## 2023-08-06 MED ORDER — ONDANSETRON HCL 4 MG/2ML IJ SOLN: 4.0000 mg | Freq: Four times a day (QID) | INTRAMUSCULAR | Status: DC | PRN

## 2023-08-06 MED ORDER — CEFAZOLIN SODIUM-DEXTROSE 2-4 GM/100ML-% IV SOLN
INTRAVENOUS | Status: AC
  Filled 2023-08-06: qty 100

## 2023-08-06 MED ORDER — DIPHENHYDRAMINE HCL 12.5 MG/5ML PO ELIX
12.5000 mg | ORAL_SOLUTION | Freq: Four times a day (QID) | ORAL | Status: DC | PRN
  Administered 2023-08-07: 12.5 mg via ORAL
  Filled 2023-08-06: qty 10

## 2023-08-06 MED ORDER — ACETAMINOPHEN 500 MG PO TABS
ORAL_TABLET | ORAL | Status: AC
  Filled 2023-08-06: qty 2

## 2023-08-06 MED ORDER — KETAMINE HCL 50 MG/ML IJ SOLN
INTRAMUSCULAR | Status: DC | PRN
  Administered 2023-08-06 (×3): 10 mg via INTRAMUSCULAR

## 2023-08-06 MED ORDER — DIPHENHYDRAMINE HCL 50 MG/ML IJ SOLN: 12.5000 mg | Freq: Four times a day (QID) | INTRAMUSCULAR | Status: DC | PRN

## 2023-08-06 MED ORDER — TRANEXAMIC ACID 1000 MG/10ML IV SOLN
INTRAVENOUS | Status: AC
  Filled 2023-08-06: qty 30

## 2023-08-06 MED ORDER — MIDAZOLAM HCL 2 MG/2ML IJ SOLN
INTRAMUSCULAR | Status: AC
Start: 2023-08-06 — End: ?
  Filled 2023-08-06: qty 2

## 2023-08-06 MED ORDER — LACTATED RINGERS IV SOLN: INTRAVENOUS | Status: DC

## 2023-08-06 MED ORDER — POTASSIUM CHLORIDE IN NACL 20-0.9 MEQ/L-% IV SOLN
INTRAVENOUS | Status: DC
  Filled 2023-08-06: qty 1000

## 2023-08-06 MED ORDER — DEXAMETHASONE SODIUM PHOSPHATE 4 MG/ML IJ SOLN
INTRAMUSCULAR | Status: DC | PRN
  Administered 2023-08-06: 10 mg via INTRAVENOUS

## 2023-08-06 MED ORDER — CITALOPRAM HYDROBROMIDE 20 MG PO TABS
40.0000 mg | ORAL_TABLET | Freq: Every day | ORAL | Status: DC
  Administered 2023-08-06: 40 mg via ORAL
  Filled 2023-08-06: qty 1

## 2023-08-06 MED ORDER — PROPOFOL 10 MG/ML IV BOLUS
INTRAVENOUS | Status: DC | PRN
  Administered 2023-08-06: 150 mg via INTRAVENOUS

## 2023-08-06 MED ORDER — ONDANSETRON HCL 4 MG/2ML IJ SOLN
INTRAMUSCULAR | Status: AC
  Filled 2023-08-06: qty 2

## 2023-08-06 MED ORDER — BUPIVACAINE HCL (PF) 0.5 % IJ SOLN
INTRAMUSCULAR | Status: DC | PRN
  Administered 2023-08-06 (×2): 20 mL via PERINEURAL

## 2023-08-06 MED ORDER — ACETAMINOPHEN 325 MG PO TABS
650.0000 mg | ORAL_TABLET | Freq: Four times a day (QID) | ORAL | Status: DC | PRN
  Administered 2023-08-06 – 2023-08-07 (×2): 650 mg via ORAL
  Filled 2023-08-06 (×2): qty 2

## 2023-08-06 MED ORDER — EZETIMIBE 10 MG PO TABS: 10.0000 mg | ORAL_TABLET | Freq: Every day | ORAL | Status: DC

## 2023-08-06 MED ORDER — FENTANYL CITRATE (PF) 100 MCG/2ML IJ SOLN
25.0000 ug | INTRAMUSCULAR | Status: DC | PRN
  Administered 2023-08-06 (×2): 50 ug via INTRAVENOUS

## 2023-08-06 MED ORDER — CLONAZEPAM 2 MG PO TABS: 2.0000 mg | ORAL_TABLET | Freq: Two times a day (BID) | ORAL | Status: DC | PRN

## 2023-08-06 MED ORDER — FENTANYL CITRATE (PF) 100 MCG/2ML IJ SOLN
100.0000 ug | Freq: Once | INTRAMUSCULAR | Status: AC
  Administered 2023-08-06: 100 ug via INTRAVENOUS

## 2023-08-06 MED ORDER — 0.9 % SODIUM CHLORIDE (POUR BTL) OPTIME
TOPICAL | Status: DC | PRN
  Administered 2023-08-06: 1000 mL

## 2023-08-06 MED ORDER — LIDOCAINE 2% (20 MG/ML) 5 ML SYRINGE
INTRAMUSCULAR | Status: AC
  Filled 2023-08-06: qty 5

## 2023-08-06 MED ORDER — PROPOFOL 500 MG/50ML IV EMUL
INTRAVENOUS | Status: AC
  Filled 2023-08-06: qty 50

## 2023-08-06 MED ORDER — ONDANSETRON 4 MG PO TBDP: 4.0000 mg | ORAL_TABLET | Freq: Four times a day (QID) | ORAL | Status: DC | PRN

## 2023-08-06 SURGICAL SUPPLY — 46 items
APPLIER CLIP 9.375 MED OPEN (MISCELLANEOUS) ×2
BENZOIN TINCTURE PRP APPL 2/3 (GAUZE/BANDAGES/DRESSINGS) ×3 IMPLANT
BIOPATCH RED 1 DISK 7.0 (GAUZE/BANDAGES/DRESSINGS) IMPLANT
BLADE CLIPPER SURG (BLADE) IMPLANT
BLADE HEX COATED 2.75 (ELECTRODE) ×3 IMPLANT
BLADE SURG 10 STRL SS (BLADE) ×3 IMPLANT
BLADE SURG 15 STRL LF DISP TIS (BLADE) ×3 IMPLANT
CANISTER SUCT 1200ML W/VALVE (MISCELLANEOUS) ×3 IMPLANT
CHLORAPREP W/TINT 26 (MISCELLANEOUS) ×3 IMPLANT
CLIP APPLIE 9.375 MED OPEN (MISCELLANEOUS) ×3 IMPLANT
COVER BACK TABLE 60X90IN (DRAPES) ×3 IMPLANT
COVER MAYO STAND STRL (DRAPES) ×3 IMPLANT
COVER PROBE CYLINDRICAL 5X96 (MISCELLANEOUS) ×3 IMPLANT
DRAIN CHANNEL 19F RND (DRAIN) ×3 IMPLANT
DRAPE LAPAROSCOPIC ABDOMINAL (DRAPES) ×3 IMPLANT
DRAPE UTILITY XL STRL (DRAPES) ×3 IMPLANT
DRSG TEGADERM 2-3/8X2-3/4 SM (GAUZE/BANDAGES/DRESSINGS) IMPLANT
ELECT BLADE 4.0 EZ CLEAN MEGAD (MISCELLANEOUS) ×2
ELECT REM PT RETURN 9FT ADLT (ELECTROSURGICAL) ×2
ELECTRODE BLDE 4.0 EZ CLN MEGD (MISCELLANEOUS) IMPLANT
ELECTRODE REM PT RTRN 9FT ADLT (ELECTROSURGICAL) ×3 IMPLANT
EVACUATOR SILICONE 100CC (DRAIN) ×3 IMPLANT
GAUZE SPONGE 4X4 12PLY STRL (GAUZE/BANDAGES/DRESSINGS) ×6 IMPLANT
GLOVE BIO SURGEON STRL SZ7 (GLOVE) ×3 IMPLANT
GLOVE BIOGEL PI IND STRL 7.5 (GLOVE) ×3 IMPLANT
GOWN STRL REUS W/ TWL LRG LVL3 (GOWN DISPOSABLE) ×6 IMPLANT
NDL HYPO 25X1 1.5 SAFETY (NEEDLE) IMPLANT
NDL SAFETY ECLIPSE 18X1.5 (NEEDLE) IMPLANT
NEEDLE HYPO 25X1 1.5 SAFETY (NEEDLE)
NS IRRIG 1000ML POUR BTL (IV SOLUTION) ×3 IMPLANT
PACK BASIN DAY SURGERY FS (CUSTOM PROCEDURE TRAY) ×3 IMPLANT
PENCIL SMOKE EVACUATOR (MISCELLANEOUS) ×3 IMPLANT
PIN SAFETY STERILE (MISCELLANEOUS) ×3 IMPLANT
SLEEVE SCD COMPRESS KNEE MED (STOCKING) ×3 IMPLANT
SPIKE FLUID TRANSFER (MISCELLANEOUS) ×3 IMPLANT
SPONGE T-LAP 18X18 ~~LOC~~+RFID (SPONGE) ×3 IMPLANT
STRIP CLOSURE SKIN 1/2X4 (GAUZE/BANDAGES/DRESSINGS) ×3 IMPLANT
SUT ETHILON 2 0 FS 18 (SUTURE) ×3 IMPLANT
SUT SILK 2 0 SH (SUTURE) ×3 IMPLANT
SUT VICRYL 3-0 CR8 SH (SUTURE) ×3 IMPLANT
SYR CONTROL 10ML LL (SYRINGE) IMPLANT
TOWEL GREEN STERILE FF (TOWEL DISPOSABLE) ×3 IMPLANT
TRACER MAGTRACE VIAL (MISCELLANEOUS) IMPLANT
TRAY FAXITRON CT DISP (TRAY / TRAY PROCEDURE) IMPLANT
TUBE CONNECTING 20X1/4 (TUBING) ×3 IMPLANT
YANKAUER SUCT BULB TIP NO VENT (SUCTIONS) ×3 IMPLANT

## 2023-08-06 NOTE — Discharge Instructions (Addendum)
 [facility] [phone]  MASTECTOMY: POST OP INSTRUCTIONS  Always review your discharge instruction sheet given to you by the facility where your surgery was performed. IF YOU HAVE DISABILITY OR FAMILY LEAVE FORMS, YOU MUST BRING THEM TO THE OFFICE FOR PROCESSING.   DO NOT GIVE THEM TO YOUR DOCTOR. A prescription for pain medication may be given to you upon discharge.  Take your pain medication as prescribed, if needed.  If narcotic pain medicine is not needed, then you may take acetaminophen  (Tylenol ) or ibuprofen (Advil) as needed. Take your usually prescribed medications unless otherwise directed. If you need a refill on your pain medication, please contact your pharmacy.  They will contact our office to request authorization.  Prescriptions will not be filled after 5pm or on week-ends. You should follow a light diet the first few days after arrival home, such as soup and crackers, etc.  Resume your normal diet the day after surgery. Most patients will experience some swelling and bruising on the chest and underarm.  Ice packs will help.  Swelling and bruising can take several days to resolve.  It is common to experience some constipation if taking pain medication after surgery.  Increasing fluid intake and taking a stool softener (such as Colace) will usually help or prevent this problem from occurring.  A mild laxative (Milk of Magnesia or Miralax) should be taken according to package instructions if there are no bowel movements after 48 hours. Unless discharge instructions indicate otherwise, leave your bandage dry and in place until your next appointment in 3-5 days.  You may take a limited sponge bath.  No tube baths or showers until the drains are removed.  You may have steri-strips (small skin tapes) in place directly over the incision.  These strips should be left on the skin for 7-10 days.  If your surgeon used skin glue on the incision, you may shower in 24 hours.   The glue will flake off over the next 2-3 weeks.  Any sutures or staples will be removed at the office during your follow-up visit. DRAINS:  If you have drains in place, it is important to keep a list of the amount of drainage produced each day in your drains.  Before leaving the hospital, you should be instructed on drain care.  Call our office if you have any questions about your drains. ACTIVITIES:  You may resume regular (light) daily activities beginning the next day--such as daily self-care, walking, climbing stairs--gradually increasing activities as tolerated.  You may have sexual intercourse when it is comfortable.  Refrain from any heavy lifting or straining until approved by your doctor. You may drive when you are no longer taking prescription pain medication, you can comfortably wear a seatbelt, and you can safely maneuver your car and apply brakes. RETURN TO WORK:  __________________________________________________________ Rosine should see your doctor in the office for a follow-up appointment approximately 3-5 days after your surgery.  Your doctor's nurse will typically make your follow-up appointment when she calls you with your pathology report.  Expect your pathology report 2-3 business days after your surgery.  You may call to check if you do not hear from us  after three days.   OTHER INSTRUCTIONS: ______________________________________________________________________________________________ ____________________________________________________________________________________________ WHEN TO CALL YOUR DOCTOR: Fever over 101.0 Nausea and/or vomiting Extreme swelling or bruising Continued bleeding from incision. Increased pain, redness, or drainage from the incision. The clinic staff is available to answer your questions during regular business hours.  Please don't hesitate  to call and ask to speak to one of the nurses for clinical concerns.  If you have a medical emergency, go to the  nearest emergency room or call 911.  A surgeon from [facility] is always on call at the hospital. [address], [place], [place]  [address] ? [address] [address], [place], [place]   [address] [phone] ? [phone] ? FAX [phone] Web site: [email or web address]    Post Anesthesia Home Care Instructions  Activity: Get plenty of rest for the remainder of the day. A responsible individual must stay with you for 24 hours following the procedure.  For the next 24 hours, DO NOT: -Drive a car -Advertising copywriter -Drink alcoholic beverages -Take any medication unless instructed by your physician -Make any legal decisions or sign important papers.  Meals: Start with liquid foods such as gelatin or soup. Progress to regular foods as tolerated. Avoid greasy, spicy, heavy foods. If nausea and/or vomiting occur, drink only clear liquids until the nausea and/or vomiting subsides. Call your physician if vomiting continues.  Special Instructions/Symptoms: Your throat may feel dry or sore from the anesthesia or the breathing tube placed in your throat during surgery. If this causes discomfort, gargle with warm salt water. The discomfort should disappear within 24 hours.  If you had a scopolamine patch placed behind your ear for the management of post- operative nausea and/or vomiting:  1. The medication in the patch is effective for 72 hours, after which it should be removed.  Wrap patch in a tissue and discard in the trash. Wash hands thoroughly with soap and water. 2. You may remove the patch earlier than 72 hours if you experience unpleasant side effects which may include dry mouth, dizziness or visual disturbances. 3. Avoid touching the patch. Wash your hands with soap and water after contact with the patch.     Regional Anesthesia Blocks  1. You may not be able to move or feel the blocked extremity after a regional anesthetic block. This may last may last from 3-48 hours  after placement, but it will go away. The length of time depends on the medication injected and your individual response to the medication. As the nerves start to wake up, you may experience tingling as the movement and feeling returns to your extremity. If the numbness and inability to move your extremity has not gone away after 48 hours, please call your surgeon.   2. The extremity that is blocked will need to be protected until the numbness is gone and the strength has returned. Because you cannot feel it, you will need to take extra care to avoid injury. Because it may be weak, you may have difficulty moving it or using it. You may not know what position it is in without looking at it while the block is in effect.  3. For blocks in the legs and feet, returning to weight bearing and walking needs to be done carefully. You will need to wait until the numbness is entirely gone and the strength has returned. You should be able to move your leg and foot normally before you try and bear weight or walk. You will need someone to be with you when you first try to ensure you do not fall and possibly risk injury.  4. Bruising and tenderness at the needle site are common side effects and will resolve in a few days.  5. Persistent numbness or new problems with movement should be communicated to the  surgeon or the [facility] [phone] [facility] [phone]).  Information for Discharge Teaching: EXPAREL  (bupivacaine  liposome injectable suspension)   Pain relief is important to your recovery. The goal is to control your pain so you can move easier and return to your normal activities as soon as possible after your procedure. Your physician may use several types of medicines to manage pain, swelling, and more.  Your surgeon or anesthesiologist gave you EXPAREL (bupivacaine ) to help control your pain after surgery.  EXPAREL  is a local anesthetic designed to release slowly over  an extended period of time to provide pain relief by numbing the tissue around the surgical site. EXPAREL  is designed to release pain medication over time and can control pain for up to 72 hours. Depending on how you respond to EXPAREL , you may require less pain medication during your recovery. EXPAREL  can help reduce or eliminate the need for opioids during the first few days after surgery when pain relief is needed the most. EXPAREL  is not an opioid and is not addictive. It does not cause sleepiness or sedation.   Important! A teal colored band has been placed on your arm with the date, time and amount of EXPAREL  you have received. Please leave this armband in place for the full 96 hours following administration, and then you may remove the band. If you return to the hospital for any reason within 96 hours following the administration of EXPAREL , the armband provides important information that your health care providers to know, and alerts them that you have received this anesthetic.    Possible side effects of EXPAREL : Temporary loss of sensation or ability to move in the area where medication was injected. Nausea, vomiting, constipation Rarely, numbness and tingling in your mouth or lips, lightheadedness, or anxiety may occur. Call your doctor right away if you think you may be experiencing any of these sensations, or if you have other questions regarding possible side effects.  Follow all other discharge instructions given to you by your surgeon or nurse. Eat a healthy diet and drink plenty of water or other fluids.  About my Jackson-Pratt Bulb Drain  What is a Jackson-Pratt bulb? A Jackson-Pratt is a soft, round device used to collect drainage. It is connected to a long, thin drainage catheter, which is held in place by one or two small stiches near your surgical incision site. When the bulb is squeezed, it forms a vacuum, forcing the drainage to empty into the bulb.  Emptying the  Jackson-Pratt bulb- To empty the bulb: 1. Release the plug on the top of the bulb. 2. Pour the bulb's contents into a measuring container which your nurse will provide. 3. Record the time emptied and amount of drainage. Empty the drain(s) as often as your     doctor or nurse recommends.  Date                  Time                    Amount (Drain 1)                 Amount (Drain 2)  _____________________________________________________________________  _____________________________________________________________________  _____________________________________________________________________  _____________________________________________________________________  _____________________________________________________________________  _____________________________________________________________________  _____________________________________________________________________  _____________________________________________________________________  Squeezing the Jackson-Pratt Bulb- To squeeze the bulb: 1. Make sure the plug at the top of the bulb is open. 2. Squeeze the bulb tightly in your fist. You will hear air squeezing from the  bulb. 3. Replace the plug while the bulb is squeezed. 4. Use a safety pin to attach the bulb to your clothing. This will keep the catheter from     pulling at the bulb insertion site.  When to call your doctor- Call your doctor if: Drain site becomes red, swollen or hot. You have a fever greater than 101 degrees F. There is oozing at the drain site. Drain falls out (apply a guaze bandage over the drain hole and secure it with tape). Drainage increases daily not related to activity patterns. (You will usually have more drainage when you are active than when you are resting.) Drainage has a bad odor.

## 2023-08-06 NOTE — Op Note (Signed)
 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  She recently underwent screening mammogram followed by diagnostic mammogram that revealed a broad area of calcifications in the central/ medial right breast up to 6.0 cm.  She underwent three stereotactic biopsies of the anterior/ posterior/ and medial margins of this area of calcifications.   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 The medial biopsy revealed the same findings The anterior biopsy revealed DCIS, high-grade with suspicion for invasion.   Prognostic panel - ER - 10% positive                         PR - negative                         Ki-67 - 20%                         Her 2 pending   The patient has decided to proceed with a right mastectomy as well as a risk reducing left mastectomy  Description of procedure:  In the preoperative area, the patient happened injected in the right retroareolar space with mag trace.  The patient is brought to the operating room and placed in the supine position on the operating table.  After an adequate level of general anesthesia was obtained her entire chest and both axilla were prepped with ChloraPrep and draped in sterile fashion.  A timeout was taken to ensure the proper patient and proper procedure.  We began on the left (noncancer) side.  I drew an elliptical incision to include the nipple areolar complex with a skin marker.  We made a skin incision with a #10 scalpel.  We dissected  superiorly to the infraclavicular chest wall, medially to the edge of the sternum, laterally to the anterior edge of the latissimus muscle, and inferiorly to the inframammary crease.  We then dissected the breast tissue off of the underlying pectoralis muscle, taking the anterior pectoralis fascia with the specimen.  We remove the entire specimen and oriented with a long suture lateral and a short suture superior.  We inspected carefully for hemostasis.  A sponge soaked in TXA was placed into the cavity and left for several minutes.  We again inspected for hemostasis.  A 19 French drain was brought in through the stab incision laterally and put secured with a 2-0 Ethilon.  This was placed across the entire mastectomy wound.  The wound was closed with multiple interrupted deep 3-0 Vicryl sutures.  4-0 Monocryl was used to close the skin in a subcuticular fashion.    We then turned our attention to the right side.  I drew an elliptical incision to include the nipple areolar complex with a skin marker.  We made a skin incision with a #10 scalpel.  We dissected superiorly to the infraclavicular chest wall, medially to the edge of the sternum, laterally to the anterior edge of the latissimus muscle, and inferiorly to the  inframammary crease.  At the axilla, I interrogated the axillary contents with the mag probe.  We identified an area of activity.  We dissected up in the axilla and removed 2 lymph nodes that revealed magnetic activity.  There is minimal background activity.  These were sent as sentinel lymph node #1 and sentinel lymph node #2.  Hemostasis was obtained using cautery as well as clips.  We then dissected the breast tissue off of the underlying pectoralis muscle, taking the anterior pectoralis fascia with the specimen.  We remove the entire specimen and oriented with a long suture lateral and a short suture superior.  We inspected carefully for hemostasis.  A sponge soaked in TXA was placed into the  cavity and left for several minutes.  We again inspected for hemostasis.  A 19 French drain was brought in through the stab incision laterally and put secured with a 2-0 Ethilon.  This was placed across the entire mastectomy wound.  The wound was closed with multiple interrupted deep 3-0 Vicryl sutures.  4-0 Monocryl was used to close the skin in a subcuticular fashion.  Both drains were placed to suction.  Benzoin and Steri-Strips were applied to both incisions.  Dry dressings were applied.  The patient is extubated and brought to recovery room in stable condition.  All sponge, instrument, and needle counts are correct.  Donnice POUR. Belinda, MD, [facility]  General Surgery   08/06/2023 10:18 AM

## 2023-08-06 NOTE — Transfer of Care (Signed)
 Immediate Anesthesia Transfer of Care Note  Patient: Megan Rollins  Procedure(s) Performed: RIGHT MASTECTOMY WITH SENTINEL LYMPH NODE BIOPSY (Right: Breast) LEFT RISK REDUCING MASTECTOMY (Left: Breast)  Patient Location: PACU  Anesthesia Type:GA combined with regional for post-op pain  Level of Consciousness: sedated  Airway & Oxygen Therapy: Patient Spontanous Breathing and Patient connected to face mask oxygen  Post-op Assessment: Report given to RN and Post -op Vital signs reviewed and stable  Post vital signs: Reviewed and stable  Last Vitals:  Vitals Value Taken Time  BP 104/56 08/06/23 1017  Temp 36.2 C 08/06/23 1017  Pulse 75 08/06/23 1019  Resp 11 08/06/23 1019  SpO2 100 % 08/06/23 1019  Vitals shown include unfiled device data.  Last Pain:  Vitals:   08/06/23 0645  TempSrc: Temporal  PainSc: 0-No pain         Complications: No notable events documented.

## 2023-08-06 NOTE — Anesthesia Procedure Notes (Signed)
Anesthesia Regional Block: Pectoralis block   Pre-Anesthetic Checklist: , timeout performed,  Correct Patient, Correct Site, Correct Laterality,  Correct Procedure, Correct Position, site marked,  Risks and benefits discussed,  Pre-op evaluation,  At surgeon's request and post-op pain management  Laterality: Right  Prep: Maximum Sterile Barrier Precautions used, chloraprep       Needles:  Injection technique: Single-shot  Needle Type: Echogenic Stimulator Needle     Needle Length: 9cm  Needle Gauge: 21     Additional Needles:   Procedures:,,,, ultrasound used (permanent image in chart),,    Narrative:  Start time: 08/06/2023 7:20 AM End time: 08/06/2023 7:25 AM Injection made incrementally with aspirations every 5 mL. Anesthesiologist: Elmer Picker, MD

## 2023-08-06 NOTE — H&P (Signed)
 Subjective    Chief Complaint: NEW BREAST CANCER   Previous breast surgeon - Dr. Belvie Patee Previous oncologist - Dr. Arvella Hof   History of Present Illness: Megan Rollins is a 68 y.o. female who is seen today as an office consultation at the request of Dr. Belinda for evaluation of NEW BREAST CANCER .   This is a 68 year old female with a remote history of left breast cancer in 2001 treated with lumpectomy followed by radiation, chemotherapy, and Tamoxifen x 5 years.  She recently underwent screening mammogram followed by diagnostic mammogram that revealed a broad area of calcifications in the central/ medial right breast up to 6.0 cm.  She underwent three stereotactic biopsies of the anterior/ posterior/ and medial margins of this area of calcifications.   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 The medial biopsy revealed the same findings The anterior biopsy revealed DCIS, high-grade with suspicion for invasion.   Prognostic panel - ER - 10% positive                         PR - negative                         Ki-67 - 20%                         Her 2 pending   The patient has decided to proceed with a right mastectomy as well as a risk reducing left mastectomy   Review of Systems: A complete review of systems was obtained from the patient.  I have reviewed this information and discussed as appropriate with the patient.  See HPI as well for other ROS.   Review of Systems  Constitutional: Negative.   HENT: Negative.    Eyes: Negative.   Respiratory: Negative.    Cardiovascular: Negative.   Gastrointestinal: Negative.   Genitourinary: Negative.   Musculoskeletal: Negative.   Skin: Negative.   Neurological: Negative.   Endo/Heme/Allergies: Negative.   Psychiatric/Behavioral:  Positive for depression. The patient is nervous/anxious.         Medical History:     Past Medical History:  Diagnosis Date   Anxiety     History of cancer            Patient Active Problem List  Diagnosis   Invasive ductal carcinoma of breast, female, right (CMS/HHS-HCC)           Past Surgical History:  Procedure Laterality Date   Left Brest Lumpectomy Left 2001   COLONOSCOPY   12/27/2010    Adenomatous Polyps: [facility] 11/2013   COLONOSCOPY   11/30/2014    Sessile Serrated Adenoma: [facility] 10/2019   COLONOSCOPY   08/25/2018    Sessile Serrated Adenoma: [facility] 08/2021   Colon @ [facility]   03/21/2023    Tubular adenoma/PHx CP/Polyp was small TA. Repeat colonoscopy in 7 years/[facility]   ARTHROSCOPIC ROTATOR CUFF REPAIR       CESAREAN SECTION       HYSTERECTOMY       JOINT REPLACEMENT       Kidney surgery       Leg surgery              Allergies  Allergen Reactions   Clarithromycin Nausea   Penicillins Rash   Sulfacetamide Sodium Rash  Current Outpatient Medications on File Prior to Visit  Medication Sig Dispense Refill   alendronate  (FOSAMAX ) 70 MG tablet TAKE 1 TABLET EVERY 7 DAYS,WITH A FULL GLASS OF WATER ON AN EMPTY STOMACH       calcium phos-D3-magnesium-zinc 100 mg-25 mcg- 17 mg-1.67 mg Chew Take by mouth       citalopram  (CELEXA ) 40 MG tablet Take 40 mg by mouth at bedtime       clonazePAM  (KLONOPIN ) 2 MG tablet TAKE 1 TABLET BY MOUTH TWICE DAILY AS NEEDED FOR MOMENTS OF HIGH ANXIETY       gabapentin  (NEURONTIN ) 300 MG capsule TAKE 1 CAPSULE BY MOUTH NIGHTLY AT BEDTIME.       multivitamin tablet Take 1 tablet by mouth once daily       ezetimibe  (ZETIA ) 10 mg tablet Take 1 tablet by mouth every evening (Patient not taking: Reported on 07/01/2023)       fluticasone propionate (FLONASE) 50 mcg/actuation nasal spray INSTILL ONE SPRAY IN EACH NOSTRIL ONCE DAILY (Patient not taking: Reported on 07/01/2023)       sodium, potassium, and magnesium (SUPREP) oral solution Take 1 Bottle by mouth as directed One kit contains 2 bottles.  Take both bottles at the times instructed by your provider. (Patient not taking: Reported on 07/01/2023)  354 mL 0    No current facility-administered medications on file prior to visit.           Family History  Problem Relation Age of Onset   Coronary Artery Disease (Blocked arteries around heart) Mother     Breast cancer Mother        Social History        Tobacco Use  Smoking Status Former   Types: Cigarettes   Start date: 2019  Smokeless Tobacco Never      Social History         Socioeconomic History   Marital status: Divorced  Tobacco Use   Smoking status: Former      Types: Cigarettes      Start date: 2019   Smokeless tobacco: Never  Substance and Sexual Activity   Alcohol use: Never   Drug use: Never    Social Drivers of Acupuncturist Strain: Low Risk  (02/14/2023)    Received from [facility]    Overall Financial Resource Strain (CARDIA)     Difficulty of Paying Living Expenses: Not very hard  Food Insecurity: No Food Insecurity (02/18/2023)    Received from [facility]    Hunger Vital Sign     Worried About Running Out of Food in the Last Year: Never true     Ran Out of Food in the Last Year: Never true  Transportation Needs: No Transportation Needs (02/14/2023)    Received from [facility] - Transportation     Lack of Transportation (Medical): No     Lack of Transportation (Non-Medical): No  Physical Activity: Insufficiently Active (02/14/2023)    Received from [facility]    Exercise Vital Sign     Days of Exercise per Week: 2 days     Minutes of Exercise per Session: 20 min  Stress: No Stress Concern Present (02/14/2023)    Received from [facility] - Occupational Stress Questionnaire     Feeling of Stress : Only a little  Social Connections: Unknown (02/14/2023)    Received from [facility]  Social Advertising Account Executive [NHANES]     Frequency of Communication with Friends and Family: Twice a week     Frequency of Social Gatherings with Friends and Family: Once a  week     Active Member of Clubs or Organizations: Yes     Attends Engineer, Structural: More than 4 times per year     Marital Status: Divorced      Objective:          Vitals:    07/01/23 1509 07/01/23 1510  BP: 102/64    Pulse: 90    Temp: 36.6 C (97.8 F)    SpO2: 97%    Weight: 82.3 kg (181 lb 6.4 oz)    Height: 172.7 cm (5' 8)    PainSc:     7  PainLoc:   Breast    Body mass index is 27.58 kg/m.   Physical Exam    Constitutional:  WDWN in NAD, conversant, no obvious deformities; lying in bed comfortably Eyes:  Pupils equal, round; sclera anicteric; moist conjunctiva; no lid lag HENT:  Oral mucosa moist; good dentition  Neck:  No masses palpated, trachea midline; no thyromegaly Lungs:  CTA bilaterally; normal respiratory effort Breasts:  symmetric, no nipple changes; no palpable masses or lymphadenopathy on left side.  Healed lumpectomy incision. Right breast shows no lymphadenopathy or nipple changes.  Firm palpable hematoma around the three biopsy sites in the medial right breast CV:  Regular rate and rhythm; no murmurs; extremities well-perfused with no edema Abd:  +bowel sounds, soft, non-tender, no palpable organomegaly; no palpable hernias Musc:  Normal gait; no apparent clubbing or cyanosis in extremities Lymphatic:  No palpable cervical or axillary lymphadenopathy Skin:  Warm, dry; no sign of jaundice Psychiatric - alert and oriented x 4; calm mood and affect     Labs, Imaging and Diagnostic Testing: Patient Name: Megan Rollins, Megan Rollins Visit # : [id number]  MRN: [id number] Physician: Abelina Gee DOB/Age [birth year] (Age: 79) Gender: F Collected Date: 06/18/2023 Received Date: 06/18/2023  FINAL DIAGNOSIS       1. Breast, right, needle core biopsy,  :      INVASIVE DUCTAL CARCINOMA      DUCTAL CARCINOMA IN SITU, CRIBRIFORM AND SOLID TYPES WITH NECROSIS, HIGH NUCLEAR      GRADE      TUBULE FORMATION: SCORE 3      NUCLEAR PLEOMORPHISM: SCORE 3       MITOTIC COUNT: SCORE 1      TOTAL SCORE: 7      OVERALL GRADE: 2      LYMPHOVASCULAR INVASION: NOT IDENTIFIED      CANCER LENGTH: 5 MM      CALCIFICATIONS: PRESENT      OTHER FINDINGS: NONE      SEE NOTE       2. Breast, right, needle core biopsy,  :      INVASIVE DUCTAL CARCINOMA      DUCTAL CARCINOMA IN SITU, CRIBRIFORM AND SOLID TYPES WITH NECROSIS, HIGH NUCLEAR      GRADE      TUBULE FORMATION: SCORE 3      NUCLEAR PLEOMORPHISM: SCORE 3      MITOTIC COUNT: SCORE 1      TOTAL SCORE: 7      OVERALL GRADE: 2      LYMPHOVASCULAR INVASION: NOT IDENTIFIED      CANCER LENGTH: 1.8 MM      CALCIFICATIONS: PRESENT      OTHER FINDINGS:  NONE       3. Breast, right, needle core biopsy,  :      DUCTAL CARCINOMA IN SITU, HIGH GRADE, SOLID AND PAPILLARY TYPES      SUSPICIOUS FOR INVASION (IMMUNOHISTOCHEMICAL STAINS WILL BE REPORTED IN AN      ADDENDUM)      NECROSIS: PRESENT      CALCIFICATIONS: PRESENT      DCIS LENGTH: 1.6 CM      SEE NOTE       Diagnosis Note : Dr.Picklesimer reviewed the case and concurs with the      interpretation.A breast prognostic profile (ER and PR) is pending and will be      reported in an addendum.The breast center of [facility] was notified on      06/19/2023.    ADDENDUM Breast, right, needle core biospy PROGNOSTIC INDICATORS  Results: IMMUNOHISTOCHEMICAL AND MORPHOMETRIC ANALYSIS PERFORMED MANUALLY The tumor cells are equivocal for Her2 (2+). Her2 by FISH will be performed and the results reported separately. Estrogen Receptor:  10%, POSITIVE, MODERATE-STRONG STAINING INTENSITY. see comment. Progesterone Receptor:  0%, NEGATIVE Proliferation Marker Ki67:  20% COMMENT:  Small focus of carcinoma in situ is positive for ER, the invasive carcinoma  and the rest of DCIS components are negative for ER. The negative hormone receptor study(ies) in this case has an internal positive control.     CLINICAL DATA:  Screening recall for right breast  calcifications. The patient has history of a remote left breast lumpectomy in 2001.   EXAM: DIGITAL DIAGNOSTIC UNILATERAL RIGHT MAMMOGRAM   TECHNIQUE: Right digital diagnostic mammography was performed.   COMPARISON:  Previous exam(s).   ACR Breast Density Category b: There are scattered areas of fibroglandular density.   FINDINGS: Spot compression magnification images of the central to medial right breast demonstrates a broad area of loosely scattered calcifications, which in the craniocaudal orientation are in a triangular orientation, with the largest dimensions spanning 6 cm in the anterior to posterior orientation. In the medial to lateral orientation they span approximately 3.4 cm.   IMPRESSION: There is a broad span of calcifications in the central to medial aspect of the right breast spanning up to 6.0 cm.   RECOMMENDATION: A 3 sites stereotactic biopsy is recommended for the anterior, posterior and medial margin of the calcifications.   I have discussed the findings and recommendations with the patient. If applicable, a reminder letter will be sent to the patient regarding the next appointment.   BI-RADS CATEGORY  4: Suspicious.     Electronically Signed   By: Rosaline Collet M.D.   On: 06/15/2023 11:44   Assessment and Plan:  Diagnoses and all orders for this visit:   Invasive ductal carcinoma of breast, female, right (CMS/HHS-HCC)   After long discussion with the patient, she has opted for a right mastectomy with sentinel lymph node biopsy as well as a risk reducing left mastectomy.    She has already contacted Dr. Andriette office and arrange oncology follow-up.  There does not appear to be a role for radiation therapy at this time.   The surgical procedure has been discussed with the patient.  Potential risks, benefits, alternative treatments, and expected outcomes have been explained.  All of the patient's questions at this time have been answered.   The likelihood of reaching the patient's treatment goal is good.  The patient understands the proposed surgical procedure and wishes to proceed.    Donnice POUR. Belinda, MD, [facility]  Surgery  General Surgery   08/06/2023 7:09 AM

## 2023-08-06 NOTE — Progress Notes (Signed)
Assisted Dr. Armond Hang with left, right, pectoralis, ultrasound guided block. Side rails up, monitors on throughout procedure. See vital signs in flow sheet. Tolerated Procedure well.

## 2023-08-06 NOTE — Anesthesia Procedure Notes (Signed)
 Procedure Name: LMA Insertion Date/Time: 08/06/2023 7:43 AM  Performed by: Julieanne Fairy BROCKS, CRNAPre-anesthesia Checklist: Patient identified, Emergency Drugs available, Suction available and Patient being monitored Patient Re-evaluated:Patient Re-evaluated prior to induction Oxygen Delivery Method: Circle system utilized Preoxygenation: Pre-oxygenation with 100% oxygen Induction Type: IV induction Ventilation: Mask ventilation without difficulty LMA: LMA inserted LMA Size: 4.0 Number of attempts: 1 Airway Equipment and Method: Bite block Placement Confirmation: positive ETCO2 Tube secured with: Tape Dental Injury: Teeth and Oropharynx as per pre-operative assessment

## 2023-08-06 NOTE — Anesthesia Preprocedure Evaluation (Addendum)
Anesthesia Evaluation  Patient identified by MRN, date of birth, ID band Patient awake    Reviewed: Allergy & Precautions, NPO status , Patient's Chart, lab work & pertinent test results  Airway Mallampati: I  TM Distance: >3 FB Neck ROM: Full    Dental no notable dental hx. (+) Teeth Intact, Dental Advisory Given   Pulmonary former smoker   Pulmonary exam normal breath sounds clear to auscultation       Cardiovascular negative cardio ROS Normal cardiovascular exam Rhythm:Regular Rate:Normal     Neuro/Psych  Headaches PSYCHIATRIC DISORDERS Anxiety Depression       GI/Hepatic negative GI ROS, Neg liver ROS,,,  Endo/Other  negative endocrine ROS    Renal/GU negative Renal ROS  negative genitourinary   Musculoskeletal negative musculoskeletal ROS (+)    Abdominal   Peds  Hematology negative hematology ROS (+)   Anesthesia Other Findings   Reproductive/Obstetrics                             Anesthesia Physical Anesthesia Plan  ASA: 2  Anesthesia Plan: General and Regional   Post-op Pain Management: Regional block* and Tylenol PO (pre-op)*   Induction: Intravenous  PONV Risk Score and Plan: 3 and Ondansetron, Dexamethasone and Midazolam  Airway Management Planned: Oral ETT  Additional Equipment:   Intra-op Plan:   Post-operative Plan: Extubation in OR  Informed Consent: I have reviewed the patients History and Physical, chart, labs and discussed the procedure including the risks, benefits and alternatives for the proposed anesthesia with the patient or authorized representative who has indicated his/her understanding and acceptance.     Dental advisory given  Plan Discussed with: CRNA  Anesthesia Plan Comments:        Anesthesia Quick Evaluation

## 2023-08-06 NOTE — Anesthesia Postprocedure Evaluation (Signed)
 Anesthesia Post Note  Patient: Megan Rollins  Procedure(s) Performed: RIGHT MASTECTOMY WITH SENTINEL LYMPH NODE BIOPSY (Right: Breast) LEFT RISK REDUCING MASTECTOMY (Left: Breast)     Patient location during evaluation: PACU Anesthesia Type: Regional and General Level of consciousness: awake and alert Pain management: pain level controlled Vital Signs Assessment: post-procedure vital signs reviewed and stable Respiratory status: spontaneous breathing, nonlabored ventilation, respiratory function stable and patient connected to nasal cannula oxygen Cardiovascular status: blood pressure returned to baseline and stable Postop Assessment: no apparent nausea or vomiting Anesthetic complications: no  No notable events documented.  Last Vitals:  Vitals:   08/06/23 1124 08/06/23 1200  BP:  120/61  Pulse: 76 81  Resp: 14 16  Temp:  (!) 36.3 C  SpO2: 94% 97%    Last Pain:  Vitals:   08/06/23 1225  TempSrc:   PainSc: 4                  Khaleelah Yowell L Mariel Gaudin

## 2023-08-06 NOTE — Anesthesia Procedure Notes (Signed)
Anesthesia Regional Block: Pectoralis block   Pre-Anesthetic Checklist: , timeout performed,  Correct Patient, Correct Site, Correct Laterality,  Correct Procedure, Correct Position, site marked,  Risks and benefits discussed,  Pre-op evaluation,  At surgeon's request and post-op pain management  Laterality: Left  Prep: Maximum Sterile Barrier Precautions used, chloraprep       Needles:  Injection technique: Single-shot  Needle Type: Echogenic Stimulator Needle     Needle Length: 9cm  Needle Gauge: 21     Additional Needles:   Procedures:,,,, ultrasound used (permanent image in chart),,    Narrative:  Start time: 08/06/2023 7:25 AM End time: 08/06/2023 7:30 AM Injection made incrementally with aspirations every 5 mL. Anesthesiologist: Elmer Picker, MD

## 2023-08-07 ENCOUNTER — Telehealth: Payer: Self-pay | Admitting: Genetic Counselor

## 2023-08-07 ENCOUNTER — Encounter: Payer: Self-pay | Admitting: Genetic Counselor

## 2023-08-07 ENCOUNTER — Encounter (HOSPITAL_BASED_OUTPATIENT_CLINIC_OR_DEPARTMENT_OTHER): Payer: Self-pay | Admitting: Surgery

## 2023-08-07 DIAGNOSIS — C50111 Malignant neoplasm of central portion of right female breast: Secondary | ICD-10-CM | POA: Diagnosis not present

## 2023-08-07 MED ORDER — OXYCODONE HCL 5 MG PO TABS: 5.0000 mg | ORAL_TABLET | Freq: Four times a day (QID) | ORAL | 0 refills | Status: DC | PRN

## 2023-08-07 NOTE — Telephone Encounter (Signed)
 I spoke to Megan Rollins to review results of genetic testing. [facility] BRCAPlus STAT panel negative, no mutations identified in the 13 genes included on this test. Results uploaded to labs tab. Full CancerNext-Expanded +RNAinsight panel still running, results expected in 1-2 weeks. Will re-contact when available. All Megan Rollins's questions answered at this time.

## 2023-08-07 NOTE — Discharge Summary (Signed)
 Physician Discharge Summary  Patient ID: Megan Rollins MRN: [id number] DOB/AGE: 1956/01/18 68 y.o.  Admit date: 08/06/2023 Discharge date: 08/07/2023  Admission Diagnoses:Invasive ductal carcinoma right breast  Discharge Diagnoses:  Principal Problem:   Invasive ductal carcinoma of breast, female, right [facility])   Discharged Condition: good  Hospital Course: right mastectomy with sentinel lymph node biopsy and left risk-reducing mastectomy on 08/06/23.  She did well overnight with minimal discomfort.  Drains with moderate serosanguinous output.  Tolerating diet.    Treatments: surgery: see above  Discharge Exam: Blood pressure (!) 104/59, pulse 64, temperature 98.4 F (36.9 C), resp. rate 16, height 5' 8 (1.727 m), weight 83.1 kg, SpO2 97%. WDWN in nAD Chest wall - minimal bruising; no sign of hematoma or bleeding Drains - serosanguinous fluid  Disposition: Discharge disposition: 01-Home or Self Care       Discharge Instructions     Call MD for:  persistant nausea and vomiting   Complete by: As directed    Call MD for:  redness, tenderness, or signs of infection (pain, swelling, redness, odor or green/yellow discharge around incision site)   Complete by: As directed    Call MD for:  severe uncontrolled pain   Complete by: As directed    Call MD for:  temperature >100.4   Complete by: As directed    Diet general   Complete by: As directed    Discharge wound care:   Complete by: As directed    Empty and record drain output twice daily.  Bring this information with you to your next office visit.  Sponge baths only Wear your breast binder.   Driving Restrictions   Complete by: As directed    Do not drive while taking pain medications   Increase activity slowly   Complete by: As directed       Allergies as of 08/07/2023       Reactions   Clarithromycin Nausea Only   Penicillins Rash   Sulfa Antibiotics Rash        Medication List     TAKE these  medications    alendronate  70 MG tablet Commonly known as: Fosamax  Take 1 tablet (70 mg total) by mouth every 7 (seven) days. Take with a full glass of water on an empty stomach.   Calcium-Cholecalciferol-Zinc 650-20-5.5 MG-MCG-MG Chew Chew by mouth.   cetirizine 10 MG tablet Commonly known as: ZYRTEC Take 1 tablet by mouth daily.   citalopram  40 MG tablet Commonly known as: CELEXA  Take 1 tablet (40 mg total) by mouth daily.   clonazePAM  2 MG tablet Commonly known as: KLONOPIN  TAKE 1 TABLET TWICE A DAY  AS NEEDED FOR ANXIETY.   ezetimibe  10 MG tablet Commonly known as: ZETIA  Take 1 tablet (10 mg total) by mouth daily.   gabapentin  300 MG capsule Commonly known as: NEURONTIN  TAKE 1 CAPSULE BY MOUTH NIGHTLY AT BEDTIME.   multivitamin with minerals Tabs tablet Take 1 tablet by mouth daily.   oxyCODONE  5 MG immediate release tablet Commonly known as: Oxy IR/ROXICODONE  Take 1 tablet (5 mg total) by mouth every 6 (six) hours as needed for moderate pain (pain score 4-6).               Discharge Care Instructions  (From admission, onward)           Start     Ordered   08/07/23 0000  Discharge wound care:       Comments: Empty and record drain output twice  daily.  Bring this information with you to your next office visit.  Sponge baths only Wear your breast binder.   08/07/23 9180            Follow-up Information     Belinda Cough, MD Follow up in 1 week(s).   Specialty: General Surgery Contact information: [address] [place] [address] [phone]                 Signed: Cough MARLA Belinda 08/07/2023, 8:19 AM

## 2023-08-20 ENCOUNTER — Inpatient Hospital Stay: Payer: Medicare PPO | Attending: Oncology | Admitting: Oncology

## 2023-08-20 VITALS — BP 109/68 | HR 65 | Temp 97.9°F | Resp 18 | Ht 68.0 in | Wt 179.3 lb

## 2023-08-20 DIAGNOSIS — Z171 Estrogen receptor negative status [ER-]: Secondary | ICD-10-CM

## 2023-08-20 DIAGNOSIS — C50811 Malignant neoplasm of overlapping sites of right female breast: Secondary | ICD-10-CM | POA: Diagnosis not present

## 2023-08-20 DIAGNOSIS — C50111 Malignant neoplasm of central portion of right female breast: Secondary | ICD-10-CM | POA: Diagnosis not present

## 2023-08-20 DIAGNOSIS — Z9013 Acquired absence of bilateral breasts and nipples: Secondary | ICD-10-CM | POA: Insufficient documentation

## 2023-08-20 DIAGNOSIS — Z1722 Progesterone receptor negative status: Secondary | ICD-10-CM | POA: Diagnosis not present

## 2023-08-20 DIAGNOSIS — Z1732 Human epidermal growth factor receptor 2 negative status: Secondary | ICD-10-CM | POA: Insufficient documentation

## 2023-08-20 DIAGNOSIS — Z170 Estrogen receptor positive status [ER+]: Secondary | ICD-10-CM | POA: Diagnosis not present

## 2023-08-20 DIAGNOSIS — Z803 Family history of malignant neoplasm of breast: Secondary | ICD-10-CM | POA: Diagnosis not present

## 2023-08-20 NOTE — Progress Notes (Signed)
[facility] OFFICE PROGRESS NOTE   Diagnosis: Breast cancer  INTERVAL HISTORY:   Ms. Antenucci underwent bilateral mastectomy procedures right axillary sentinel lymph node biopsy on 08/06/2023.  She reports surgery went well.  She saw Dr. Corliss Skains 08/16/2023.  Surgical drains were removed.  She has decided against TRAM reconstruction procedures.  She plans to have breast implants.  Objective:  Vital signs in last 24 hours:  Blood pressure 109/68, pulse 65, temperature 97.9 F (36.6 C), temperature source Temporal, resp. rate 18, height 5\' 8"  (1.727 m), weight 179 lb 4.8 oz (81.3 kg), SpO2 94%.    Lymphatics: No cervical, supraclavicular, or axillary nodes Resp: Lungs clear bilaterally Cardio: Regular rate and rhythm GI: No hepatosplenomegaly Vascular: No leg edema Breast: Status post bilateral mastectomy.  Healing mastectomy wounds with Steri-Strips in place.  No significant seroma.  Lab Results:  Lab Results  Component Value Date   WBC 5.9 05/16/2023   HGB 12.9 05/16/2023   HCT 39.8 05/16/2023   MCV 94 05/16/2023   PLT 198 05/16/2023   NEUTROABS 4.2 05/16/2023    CMP  Lab Results  Component Value Date   NA 140 05/16/2023   K 4.4 05/16/2023   CL 103 05/16/2023   CO2 21 05/16/2023   GLUCOSE 97 05/16/2023   BUN 16 05/16/2023   CREATININE 0.83 05/16/2023   CALCIUM 9.1 05/16/2023   PROT 6.4 05/16/2023   ALBUMIN 4.1 05/16/2023   AST 19 05/16/2023   ALT 16 05/16/2023   ALKPHOS 103 05/16/2023   BILITOT 0.5 05/16/2023   GFRNONAA >60 03/18/2020   GFRAA >60 03/18/2020    Medications: I have reviewed the patient's current medications.   Assessment/Plan: Invasive right breast cancer 06/04/2023: Screening mammogram-right breast calcifications Diagnostic right mammogram 06/15/2023: Calcifications in the central to medial right breast spanning 6 cm Stereotactic core biopsies of 3 sites in the right breast 06/18/2023: Posterior upper outer quadrant-invasive  ductal carcinoma with high-grade DCIS, upper inner quadrant-invasive ductal carcinoma with high-grade DCIS, anterior upper inner quadrant-DCIS, upper outer quadrant invasive carcinoma ER negative, PR negative, HER2 2+ (HER2 negative by FISH), small focus of DCIS ER positive Breast MRI 07/23/2023: 3.9 x 1.2 x 1.2 cm area of enhancement in the central anterior to mid right breast, no evidence of malignancy in the left breast, normal axillary lymph nodes, no internal mammary lymphadenopathy Bilateral mastectomy 08/06/2023: Left breast-benign, right breast with no residual invasive carcinoma 5 mm posterior and 1.8 medial mm foci of invasive carcinoma on the core biopsies, residual DCIS at all 3 biopsy sites, negative margins, no lymphovascular invasion, 0/4 sentinel nodes,pT1apN0 Left breast cancer 2001, stage I (T1cN0 M0), 1.2 m with 20-25% DCIS component, ER 83%, PR 94%, HER2 3+, Ki-67 15% Left lumpectomy, radiation, AC chemotherapy, and 5 years of tamoxifen Patient reports receiving 2 cycles of AC-discontinued secondary to toxicity 3.  Family history of breast cancer-mother 4.  Hysterectomy 2005 5.  History of osteoradionecrosis left anterior chest wall    Disposition: Ms. Therriault underwent bilateral mastectomy procedures on 08/06/2023.  No residual invasive carcinoma was identified.  There was residual DCIS involving the 3 previous biopsy sites.  The resection margins are negative and 4 sentinel lymph nodes were negative.  I discussed the pathology findings and adjuvant treatment options with Ms. Granholm.  She has been diagnosed with early stage breast cancer.  She has a good prognosis for a long-term disease-free survival.  The invasive carcinoma was ER negative, but the DCIS was ER positive.  We discussed the potential small absolute benefit of adjuvant chemotherapy.  She does not wish to consider chemotherapy.  Ms. Boies indicated she does not wish to consider adjuvant hormonal therapy.  The benefit  of adjuvant hormonal therapy is expected to be minimal given the triple negative phenotype of the invasive carcinoma.  She plans to proceed with a plastic surgery referral and bilateral implants.  She will return for an office visit in 6 months.  Thornton Papas, MD  08/20/2023  11:59 AM

## 2023-08-21 ENCOUNTER — Ambulatory Visit: Payer: Medicare PPO | Admitting: Family

## 2023-08-21 ENCOUNTER — Ambulatory Visit: Payer: Self-pay | Admitting: Genetic Counselor

## 2023-08-21 ENCOUNTER — Telehealth: Payer: Self-pay | Admitting: Genetic Counselor

## 2023-08-21 DIAGNOSIS — Z1379 Encounter for other screening for genetic and chromosomal anomalies: Secondary | ICD-10-CM | POA: Insufficient documentation

## 2023-08-21 NOTE — Telephone Encounter (Signed)
I spoke to Megan Rollins to review results of genetic testing. She had genetic testing with [facility] CancerNext-Expanded +RNAinsight panel. 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 We discussed that we do not use VUS to make management decisions or identify at risk family members. Discussed that we do not know why she has breast cancer or why there is cancer in the family. It could be due to a different gene that we are not testing, or maybe our current technology may not be able to pick something up.  It will be important for her to keep in contact with genetics to keep up with whether additional testing may be needed. We will contact her if new information is learned about the VUS.   Please see counseling note for further detail on this result.

## 2023-08-21 NOTE — Progress Notes (Signed)
HPI:  Ms. Forcier was previously seen in the [facility] due to a personal and family history of cancer and concerns regarding a hereditary predisposition to cancer. Please refer to our prior cancer genetics clinic note for more information regarding our discussion, assessment and recommendations, at the time. Ms. Jasmer recent genetic test results were disclosed to her, as were recommendations warranted by these results. These results and recommendations are discussed in more detail below.  CANCER HISTORY:  Oncology History  Breast cancer in female [facility])  07/25/2023 Initial Diagnosis   Breast cancer in female [facility])   07/25/2023 Cancer Staging   Staging form: Breast, AJCC 8th Edition - Clinical: cT2, cN0, cM0, ER-, PR-, HER2- - Signed by Ladene Artist, MD on 07/25/2023    Genetic Testing   Negative [facility] CancerNext-Expanded +RNAinsight panel. 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 The CancerNext-Expanded gene panel offered by [facility] and includes sequencing, rearrangement, and RNA analysis for the following 76 genes: AIP, ALK, APC, ATM, AXIN2, BAP1, BARD1, BMPR1A, BRCA1, BRCA2, BRIP1, CDC73, CDH1, CDK4, CDKN1B, CDKN2A, CEBPA, CHEK2, CTNNA1, DDX41, DICER1, ETV6, FH, FLCN, GATA2, LZTR1, MAX, MBD4, MEN1, MET, MLH1, MSH2, MSH3, MSH6, MUTYH, NF1, NF2, NTHL1, PALB2, PHOX2B, PMS2, POT1, PRKAR1A, PTCH1, PTEN, RAD51C, RAD51D, RB1, RET, RUNX1, SDHA, SDHAF2, SDHB, SDHC, SDHD, SMAD4, SMARCA4, SMARCB1, SMARCE1, STK11, SUFU, TMEM127, TP53, TSC1, TSC2, VHL, and WT1 (sequencing and deletion/duplication); EGFR, HOXB13, KIT, MITF, PDGFRA, POLD1, and POLE (sequencing only); EPCAM and GREM1 (deletion/duplication only). Report date 08/16/23.      FAMILY HISTORY:  We obtained a detailed, 4-generation family history.  Significant diagnoses are listed below: Family History  Problem Relation Age of Onset   Breast cancer Mother 56 - 28   Heart attack Mother    Liver cancer Father 71 - 42   Alcohol  abuse Father    Alcohol abuse Sister    Hepatitis Sister    Ovarian cancer Sister 32 - 89   Heart attack Brother    Narcolepsy Brother    Heart attack Brother    Prostate cancer Maternal Uncle 78 - 69       metastatic   Prostate cancer Maternal Uncle 70   Prostate cancer Maternal Uncle 50       metastatic   Kidney cancer Maternal Uncle    Pancreatic cancer Maternal Grandmother 71   Pancreatic cancer Paternal Grandfather 52   Healthy Daughter    Other Son    Stomach cancer Niece 50 - 22    Ms. Cadenhead is unaware of previous family history of genetic testing for hereditary cancer risks. There no reported Ashkenazi Jewish ancestry. There no known consanguinity.   Ms. Bachmeier reports her sister was diagnosed with ovarian cancer in her 52s, passed away from unrelated causes in her 45s. Her sister's daughter was diagnosed with metastatic stomach cancer in her 30s, deceased. She reports her brother was exposed to agent orange, his daughter was diagnosed with a brain tumor her 36s and his son was born with a congential heart defect and passed away around 60 months of age. Ms. Eppard mother was diagnosed with breast cancer at age 40, passed away from unrelated causes at age 21. She reports three maternal uncles diagnosed with prostate cancer and one maternal uncle diagnosed with kidney cancer. She reports a maternal first cousin diagnosed with breast cancer in her 34s. She reports her maternal grandmother diagnosed with pancreatic cancer at age 67. She reports her father was diagnosed with liver cancer around age  70, history of alcohol abuse. She reports her paternal uncle was diagnosed with an unknown cancer, deceased. She reports her paternal grandfather was diagnosed with pancreatic cancer at age 38, deceased.      GENETIC TEST RESULTS: Genetic testing reported out on 08/16/23 through the [facility] CancerNext-Expanded +RNAinsight cancer panel found no pathogenic mutations. The CancerNext-Expanded gene  panel offered by [facility] and includes sequencing, rearrangement, and RNA analysis for the following 76 genes: AIP, ALK, APC, ATM, AXIN2, BAP1, BARD1, BMPR1A, BRCA1, BRCA2, BRIP1, CDC73, CDH1, CDK4, CDKN1B, CDKN2A, CEBPA, CHEK2, CTNNA1, DDX41, DICER1, ETV6, FH, FLCN, GATA2, LZTR1, MAX, MBD4, MEN1, MET, MLH1, MSH2, MSH3, MSH6, MUTYH, NF1, NF2, NTHL1, PALB2, PHOX2B, PMS2, POT1, PRKAR1A, PTCH1, PTEN, RAD51C, RAD51D, RB1, RET, RUNX1, SDHA, SDHAF2, SDHB, SDHC, SDHD, SMAD4, SMARCA4, SMARCB1, SMARCE1, STK11, SUFU, TMEM127, TP53, TSC1, TSC2, VHL, and WT1 (sequencing and deletion/duplication); EGFR, HOXB13, KIT, MITF, PDGFRA, POLD1, and POLE (sequencing only); EPCAM and GREM1 (deletion/duplication only). The test report has been scanned into EPIC and is located under the Molecular Pathology section of the Results Review tab.  A portion of the result report is included below for reference.     We discussed with Ms. Muro that because current genetic testing is not perfect, it is possible there may be a gene mutation in one of these genes that current testing cannot detect, but that chance is small.  We also discussed, that there could be another gene that has not yet been discovered, or that we have not yet tested, that is responsible for the cancer diagnoses in the family. It is also possible there is a hereditary cause for the cancer in the family that Ms. Garduno did not inherit and therefore was not identified in her testing.  Therefore, it is important to remain in touch with cancer genetics in the future so that we can continue to offer Ms. Khanna the most up to date genetic testing.   Genetic testing did identify a variant of uncertain significance (VUS) was identified in the AXIN2 gene called c.2141+4A>G.  At this time, it is unknown if this variant is associated with increased cancer risk or if this is a normal finding, but most variants such as this get reclassified to being inconsequential. It should  not be used to make medical management decisions or identify at risk relatives. With time, we suspect the lab will determine the significance of this variant, if any. If we do learn more about it, we will try to contact Ms. Durall to discuss it further. However, it is important to stay in touch with [place] periodically and keep the address and phone number up to date.  We did discuss that pathogenic variants or mutations in AXIN2 are associated with a condition that can increase the risk for polyposis and/or colorectal cancer and oligodontia (congenital absence of six or more teeth). Ms. Theall did report not having her lower first molars, and does report a handful of polyps on colonoscopy (less than 20, 2024 tubular adenoma, patient otherwise reports all benign).   ADDITIONAL GENETIC TESTING: We discussed with Ms. Hoggard that her genetic testing was fairly extensive.  If there are genes identified to increase cancer risk that can be analyzed in the future, we would be happy to discuss and coordinate this testing at that time.    CANCER SCREENING RECOMMENDATIONS: Ms. Purpura test result is considered negative (normal).  This means that we have not identified a hereditary cause for her personal and family history of cancer at  this time. Most cancers happen by chance and this negative test suggests that her personal and family history of cancer may fall into this category.    Possible reasons for Ms. Geiselman's negative genetic test include:  1. There may be a gene mutation in one of these genes that current testing methods cannot detect but that chance is small.  2. There could be another gene that has not yet been discovered, or that we have not yet tested, that is responsible for the cancer diagnoses in the family.  3.  There may be no hereditary risk for cancer in the family. The cancers in Ms. [place]ville and/or her family may be sporadic/familial or due to other genetic and environmental factors. 4. It is also  possible there is a hereditary cause for the cancer in the family that Ms. Glomb did not inherit.  Therefore, it is recommended she continue to follow the cancer management and screening guidelines provided by her oncology and primary healthcare provider. An individual's cancer risk and medical management are not determined by genetic test results alone. Overall cancer risk assessment incorporates additional factors, including personal medical history, family history, and any available genetic information that may result in a personalized plan for cancer prevention and surveillance  Given Ms. Noguchi's personal and family histories, we must interpret these negative results with some caution.  Families with features suggestive of hereditary risk for cancer tend to have multiple family members with cancer, diagnoses in multiple generations and diagnoses before the age of 25. Ms. Seher family exhibits some of these features. Thus, this result may simply reflect our current inability to detect all mutations within these genes or there may be a different gene that has not yet been discovered or tested.   An individual's cancer risk and medical management are not determined by genetic test results alone. Overall cancer risk assessment incorporates additional factors, including personal medical history, family history, and any available genetic information that may result in a personalized plan for cancer prevention and surveillance.  RECOMMENDATIONS FOR FAMILY MEMBERS:  Individuals in this family might be at some increased risk of developing cancer, over the general population risk, simply due to the family history of cancer.  We recommended women in this family have a yearly mammogram beginning at age 23, or 31 years younger than the earliest onset of cancer, an annual clinical breast exam, and perform monthly breast self-exams. Women in this family should also have a gynecological exam as recommended by their  primary provider. All family members should be referred for colonoscopy starting at age 65, or 76 years younger than the earliest onset of cancer.  FOLLOW-UP: Lastly, we discussed with Ms. Arseneau that cancer genetics is a rapidly advancing field and it is possible that new genetic tests will be appropriate for her and/or her family members in the future. We encouraged her to remain in contact with cancer genetics on an annual basis so we can update her personal and family histories and let her know of advances in cancer genetics that may benefit this family.   Our contact number was provided. Ms. Ostrovsky questions were answered to her satisfaction, and she knows she is welcome to call us at anytime with additional questions or concerns.   Vassie Moment, MS, [facility] Licensed, [email or web address] [phone]

## 2023-08-22 ENCOUNTER — Other Ambulatory Visit: Payer: Self-pay

## 2023-08-23 MED ORDER — CLONAZEPAM 2 MG PO TABS: 2.0000 mg | ORAL_TABLET | Freq: Two times a day (BID) | ORAL | 1 refills | Status: DC | PRN

## 2023-08-29 LAB — SURGICAL PATHOLOGY

## 2023-09-04 DIAGNOSIS — Z923 Personal history of irradiation: Secondary | ICD-10-CM | POA: Diagnosis not present

## 2023-09-04 DIAGNOSIS — Z421 Encounter for breast reconstruction following mastectomy: Secondary | ICD-10-CM | POA: Diagnosis not present

## 2023-09-04 DIAGNOSIS — Z853 Personal history of malignant neoplasm of breast: Secondary | ICD-10-CM | POA: Diagnosis not present

## 2023-09-05 NOTE — Progress Notes (Signed)
   PROVIDER:  DONNICE DEWAYNE LIMA, MD  MRN: [id number] DOB: [birth year] DATE OF ENCOUNTER: 09/05/2023 Interval History:   This is a 68 year old female with a remote history of left breast cancer in 2001 treated with lumpectomy followed by radiation, chemotherapy, and Tamoxifen x 5 years.  She recently underwent screening mammogram followed by diagnostic mammogram that revealed a broad area of calcifications in the central/ medial right breast up to 6.0 cm.  She underwent three stereotactic biopsies of the anterior/ posterior/ and medial margins of this area of calcifications.   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 The medial biopsy revealed the same findings The anterior biopsy revealed DCIS, high-grade with suspicion for invasion.   Prognostic panel - ER - 10% positive                         PR - negative                         Ki-67 - 20%                         Her 2 -negative   The patient has decided to proceed with a right mastectomy as well as a risk reducing left mastectomy. She underwent surgery on 08/06/2023.  Her drain was removed on 08/16/2023.   Pathology returned.  She had no sign of cancer in her left breast.  The right breast showed no residual invasive ductal carcinoma.  She had 3 areas of DCIS.  All the margins are negative.  She had 4 sentinel lymph nodes that were all negative.  I gave the patient a copy of her report.  She has seen oncology and they do not plan any further adjuvant treatment. She was seen by plastic surgery yesterday for discussion of possible tissue reconstruction.  She was noted to have bilateral seromas without any sign of cellulitis. Physical Examination:   Physical Exam   Bilateral mastectomy incisions are healing well with no sign of infection.  On the right side, she has minimal if any seroma.  On the left side, there is a moderate amount of seroma fluid that is palpated.  Most of this seems to be near her  axilla.  We prepped with ChloraPrep and anesthetized with local anesthetic.  I aspirated about 30 cc of serosanguineous fluid.  The patient tolerated the procedure well.   Assessment and Plan:   Megan Rollins is a 68 y.o. female who underwent bilateral mastectomies on 08/06/23.  Diagnoses and all orders for this visit:  Invasive ductal carcinoma of breast, female, right (CMS/HHS-HCC)    She can call us  back if she has any reaccumulation of the seroma  Return in about 6 months (around 03/07/2024).   The plan was discussed in detail with the patient today, who expressed understanding.  The patient has my contact information, and understands to call me with any additional questions or concerns in the interval.  I would be happy to see the patient back sooner if the need arises.   MATTHEW KAI TSUEI, MD

## 2023-09-22 DIAGNOSIS — S93402A Sprain of unspecified ligament of left ankle, initial encounter: Secondary | ICD-10-CM | POA: Diagnosis not present

## 2023-09-23 DIAGNOSIS — H1045 Other chronic allergic conjunctivitis: Secondary | ICD-10-CM | POA: Diagnosis not present

## 2023-09-23 DIAGNOSIS — H04123 Dry eye syndrome of bilateral lacrimal glands: Secondary | ICD-10-CM | POA: Diagnosis not present

## 2023-10-07 DIAGNOSIS — S93402A Sprain of unspecified ligament of left ankle, initial encounter: Secondary | ICD-10-CM | POA: Diagnosis not present

## 2023-10-08 ENCOUNTER — Other Ambulatory Visit: Payer: Medicare PPO

## 2023-10-08 ENCOUNTER — Encounter: Payer: Medicare PPO | Admitting: Genetic Counselor

## 2023-10-14 DIAGNOSIS — M79672 Pain in left foot: Secondary | ICD-10-CM | POA: Diagnosis not present

## 2023-10-14 DIAGNOSIS — M25572 Pain in left ankle and joints of left foot: Secondary | ICD-10-CM | POA: Insufficient documentation

## 2023-10-14 DIAGNOSIS — S93402D Sprain of unspecified ligament of left ankle, subsequent encounter: Secondary | ICD-10-CM | POA: Diagnosis not present

## 2023-10-29 DIAGNOSIS — C50912 Malignant neoplasm of unspecified site of left female breast: Secondary | ICD-10-CM | POA: Diagnosis not present

## 2023-10-29 DIAGNOSIS — C50911 Malignant neoplasm of unspecified site of right female breast: Secondary | ICD-10-CM | POA: Diagnosis not present

## 2023-10-30 ENCOUNTER — Other Ambulatory Visit: Payer: Self-pay | Admitting: Family

## 2023-10-31 ENCOUNTER — Other Ambulatory Visit: Payer: Self-pay

## 2023-10-31 DIAGNOSIS — C50912 Malignant neoplasm of unspecified site of left female breast: Secondary | ICD-10-CM | POA: Diagnosis not present

## 2023-10-31 DIAGNOSIS — C50911 Malignant neoplasm of unspecified site of right female breast: Secondary | ICD-10-CM | POA: Diagnosis not present

## 2023-10-31 MED ORDER — PHENTERMINE HCL 37.5 MG PO TABS: 37.5000 mg | ORAL_TABLET | Freq: Every day | ORAL | 0 refills | Status: DC

## 2023-11-06 DIAGNOSIS — S93402D Sprain of unspecified ligament of left ankle, subsequent encounter: Secondary | ICD-10-CM | POA: Diagnosis not present

## 2023-11-07 ENCOUNTER — Other Ambulatory Visit: Payer: Self-pay | Admitting: Family

## 2023-11-07 DIAGNOSIS — R635 Abnormal weight gain: Secondary | ICD-10-CM

## 2023-11-07 DIAGNOSIS — R7303 Prediabetes: Secondary | ICD-10-CM

## 2023-11-07 DIAGNOSIS — Z13220 Encounter for screening for lipoid disorders: Secondary | ICD-10-CM

## 2023-11-07 DIAGNOSIS — E538 Deficiency of other specified B group vitamins: Secondary | ICD-10-CM

## 2023-11-07 DIAGNOSIS — I10 Essential (primary) hypertension: Secondary | ICD-10-CM

## 2023-11-07 DIAGNOSIS — Z1329 Encounter for screening for other suspected endocrine disorder: Secondary | ICD-10-CM

## 2023-11-07 DIAGNOSIS — E559 Vitamin D deficiency, unspecified: Secondary | ICD-10-CM

## 2023-11-08 ENCOUNTER — Other Ambulatory Visit

## 2023-11-08 DIAGNOSIS — Z1329 Encounter for screening for other suspected endocrine disorder: Secondary | ICD-10-CM | POA: Diagnosis not present

## 2023-11-08 DIAGNOSIS — I10 Essential (primary) hypertension: Secondary | ICD-10-CM | POA: Diagnosis not present

## 2023-11-08 DIAGNOSIS — E559 Vitamin D deficiency, unspecified: Secondary | ICD-10-CM | POA: Diagnosis not present

## 2023-11-08 DIAGNOSIS — R635 Abnormal weight gain: Secondary | ICD-10-CM | POA: Diagnosis not present

## 2023-11-08 DIAGNOSIS — E538 Deficiency of other specified B group vitamins: Secondary | ICD-10-CM | POA: Diagnosis not present

## 2023-11-08 DIAGNOSIS — Z13220 Encounter for screening for lipoid disorders: Secondary | ICD-10-CM | POA: Diagnosis not present

## 2023-11-08 DIAGNOSIS — R7303 Prediabetes: Secondary | ICD-10-CM | POA: Diagnosis not present

## 2023-11-09 LAB — CORTISOL: Cortisol: 8.2 ug/dL (ref 6.2–19.4)

## 2023-11-09 LAB — CMP14+EGFR
ALT: 18 IU/L (ref 0–32)
AST: 16 IU/L (ref 0–40)
Albumin: 4.6 g/dL (ref 3.9–4.9)
Alkaline Phosphatase: 91 IU/L (ref 44–121)
BUN/Creatinine Ratio: 17 (ref 12–28)
BUN: 15 mg/dL (ref 8–27)
Bilirubin Total: 0.4 mg/dL (ref 0.0–1.2)
CO2: 24 mmol/L (ref 20–29)
Calcium: 10 mg/dL (ref 8.7–10.3)
Chloride: 102 mmol/L (ref 96–106)
Creatinine, Ser: 0.9 mg/dL (ref 0.57–1.00)
Globulin, Total: 2 g/dL (ref 1.5–4.5)
Glucose: 100 mg/dL — ABNORMAL HIGH (ref 70–99)
Potassium: 4.4 mmol/L (ref 3.5–5.2)
Sodium: 141 mmol/L (ref 134–144)
Total Protein: 6.6 g/dL (ref 6.0–8.5)
eGFR: 71 mL/min/{1.73_m2} (ref >59)

## 2023-11-09 LAB — CBC WITH DIFF/PLATELET
Basophils Absolute: 0 10*3/uL (ref 0.0–0.2)
Basos: 1 %
EOS (ABSOLUTE): 0.1 10*3/uL (ref 0.0–0.4)
Eos: 3 %
Hematocrit: 41.2 % (ref 34.0–46.6)
Hemoglobin: 13.6 g/dL (ref 11.1–15.9)
Immature Grans (Abs): 0 10*3/uL (ref 0.0–0.1)
Immature Granulocytes: 0 %
Lymphocytes Absolute: 1.6 10*3/uL (ref 0.7–3.1)
Lymphs: 34 %
MCH: 30.9 pg (ref 26.6–33.0)
MCHC: 33 g/dL (ref 31.5–35.7)
MCV: 94 fL (ref 79–97)
Monocytes Absolute: 0.4 10*3/uL (ref 0.1–0.9)
Monocytes: 9 %
Neutrophils Absolute: 2.6 10*3/uL (ref 1.4–7.0)
Neutrophils: 53 %
Platelets: 199 10*3/uL (ref 150–450)
RBC: 4.4 x10E6/uL (ref 3.77–5.28)
RDW: 12.3 % (ref 11.7–15.4)
WBC: 4.8 10*3/uL (ref 3.4–10.8)

## 2023-11-09 LAB — LIPID PANEL
Chol/HDL Ratio: 3.7 ratio (ref 0.0–4.4)
Cholesterol, Total: 110 mg/dL (ref 100–199)
HDL: 30 mg/dL — ABNORMAL LOW (ref >39)
LDL Chol Calc (NIH): 43 mg/dL (ref 0–99)
Triglycerides: 236 mg/dL — ABNORMAL HIGH (ref 0–149)
VLDL Cholesterol Cal: 37 mg/dL (ref 5–40)

## 2023-11-09 LAB — VITAMIN B12: Vitamin B-12: 489 pg/mL (ref 232–1245)

## 2023-11-09 LAB — HEMOGLOBIN A1C
Est. average glucose Bld gHb Est-mCnc: 111 mg/dL
Hgb A1c MFr Bld: 5.5 % (ref 4.8–5.6)

## 2023-11-09 LAB — VITAMIN D 25 HYDROXY (VIT D DEFICIENCY, FRACTURES): Vit D, 25-Hydroxy: 35.1 ng/mL (ref 30.0–100.0)

## 2023-11-09 LAB — TSH: TSH: 1.02 u[IU]/mL (ref 0.450–4.500)

## 2023-11-11 ENCOUNTER — Inpatient Hospital Stay (HOSPITAL_COMMUNITY): Admit: 2023-11-11 | Admitting: Plastic Surgery

## 2023-11-11 SURGERY — RECONSTRUCTION, BREAST
Anesthesia: General | Site: Chest | Laterality: Right

## 2023-11-12 ENCOUNTER — Ambulatory Visit: Payer: Self-pay

## 2023-11-26 ENCOUNTER — Other Ambulatory Visit: Payer: Self-pay

## 2023-11-26 DIAGNOSIS — M25672 Stiffness of left ankle, not elsewhere classified: Secondary | ICD-10-CM | POA: Diagnosis not present

## 2023-11-26 DIAGNOSIS — M25572 Pain in left ankle and joints of left foot: Secondary | ICD-10-CM | POA: Diagnosis not present

## 2023-11-27 MED ORDER — PHENTERMINE HCL 37.5 MG PO TABS: 37.5000 mg | ORAL_TABLET | Freq: Every day | ORAL | 0 refills | Status: DC

## 2023-12-05 DIAGNOSIS — M25672 Stiffness of left ankle, not elsewhere classified: Secondary | ICD-10-CM | POA: Diagnosis not present

## 2023-12-05 DIAGNOSIS — M25572 Pain in left ankle and joints of left foot: Secondary | ICD-10-CM | POA: Diagnosis not present

## 2023-12-13 DIAGNOSIS — M25572 Pain in left ankle and joints of left foot: Secondary | ICD-10-CM | POA: Diagnosis not present

## 2023-12-13 DIAGNOSIS — M25672 Stiffness of left ankle, not elsewhere classified: Secondary | ICD-10-CM | POA: Diagnosis not present

## 2023-12-17 DIAGNOSIS — M25672 Stiffness of left ankle, not elsewhere classified: Secondary | ICD-10-CM | POA: Diagnosis not present

## 2023-12-17 DIAGNOSIS — M25572 Pain in left ankle and joints of left foot: Secondary | ICD-10-CM | POA: Diagnosis not present

## 2023-12-19 DIAGNOSIS — S93402D Sprain of unspecified ligament of left ankle, subsequent encounter: Secondary | ICD-10-CM | POA: Diagnosis not present

## 2023-12-23 DIAGNOSIS — M25572 Pain in left ankle and joints of left foot: Secondary | ICD-10-CM | POA: Diagnosis not present

## 2023-12-23 DIAGNOSIS — M25672 Stiffness of left ankle, not elsewhere classified: Secondary | ICD-10-CM | POA: Diagnosis not present

## 2024-01-01 DIAGNOSIS — M25672 Stiffness of left ankle, not elsewhere classified: Secondary | ICD-10-CM | POA: Diagnosis not present

## 2024-01-01 DIAGNOSIS — M25572 Pain in left ankle and joints of left foot: Secondary | ICD-10-CM | POA: Diagnosis not present

## 2024-01-03 ENCOUNTER — Other Ambulatory Visit: Payer: Self-pay | Admitting: Family

## 2024-01-06 ENCOUNTER — Other Ambulatory Visit: Payer: Self-pay | Admitting: Family

## 2024-01-08 DIAGNOSIS — M25572 Pain in left ankle and joints of left foot: Secondary | ICD-10-CM | POA: Diagnosis not present

## 2024-01-08 DIAGNOSIS — M25672 Stiffness of left ankle, not elsewhere classified: Secondary | ICD-10-CM | POA: Diagnosis not present

## 2024-02-12 ENCOUNTER — Other Ambulatory Visit: Payer: Self-pay | Admitting: Family

## 2024-02-12 ENCOUNTER — Other Ambulatory Visit

## 2024-02-12 DIAGNOSIS — Z13220 Encounter for screening for lipoid disorders: Secondary | ICD-10-CM | POA: Diagnosis not present

## 2024-02-12 DIAGNOSIS — Z1329 Encounter for screening for other suspected endocrine disorder: Secondary | ICD-10-CM

## 2024-02-12 DIAGNOSIS — R7303 Prediabetes: Secondary | ICD-10-CM

## 2024-02-12 DIAGNOSIS — I10 Essential (primary) hypertension: Secondary | ICD-10-CM

## 2024-02-12 DIAGNOSIS — E538 Deficiency of other specified B group vitamins: Secondary | ICD-10-CM

## 2024-02-12 DIAGNOSIS — E559 Vitamin D deficiency, unspecified: Secondary | ICD-10-CM

## 2024-02-12 DIAGNOSIS — R635 Abnormal weight gain: Secondary | ICD-10-CM

## 2024-02-13 ENCOUNTER — Ambulatory Visit: Payer: Self-pay

## 2024-02-13 LAB — CMP14+EGFR
ALT: 15 IU/L (ref 0–32)
AST: 15 IU/L (ref 0–40)
Albumin: 4.3 g/dL (ref 3.9–4.9)
Alkaline Phosphatase: 90 IU/L (ref 44–121)
BUN/Creatinine Ratio: 19 (ref 12–28)
BUN: 15 mg/dL (ref 8–27)
Bilirubin Total: 0.4 mg/dL (ref 0.0–1.2)
CO2: 24 mmol/L (ref 20–29)
Calcium: 9.1 mg/dL (ref 8.7–10.3)
Chloride: 100 mmol/L (ref 96–106)
Creatinine, Ser: 0.81 mg/dL (ref 0.57–1.00)
Globulin, Total: 2.1 g/dL (ref 1.5–4.5)
Glucose: 88 mg/dL (ref 70–99)
Potassium: 4.3 mmol/L (ref 3.5–5.2)
Sodium: 138 mmol/L (ref 134–144)
Total Protein: 6.4 g/dL (ref 6.0–8.5)
eGFR: 80 mL/min/1.73 (ref >59)

## 2024-02-13 LAB — LIPID PANEL
Chol/HDL Ratio: 3 ratio (ref 0.0–4.4)
Cholesterol, Total: 195 mg/dL (ref 100–199)
HDL: 65 mg/dL (ref >39)
LDL Chol Calc (NIH): 116 mg/dL — ABNORMAL HIGH (ref 0–99)
Triglycerides: 78 mg/dL (ref 0–149)
VLDL Cholesterol Cal: 14 mg/dL (ref 5–40)

## 2024-02-13 LAB — TSH: TSH: 2.77 u[IU]/mL (ref 0.450–4.500)

## 2024-02-13 LAB — VITAMIN B12: Vitamin B-12: 436 pg/mL (ref 232–1245)

## 2024-02-13 LAB — CBC WITH DIFF/PLATELET
Basophils Absolute: 0 x10E3/uL (ref 0.0–0.2)
Basos: 1 %
EOS (ABSOLUTE): 0.2 x10E3/uL (ref 0.0–0.4)
Eos: 3 %
Hematocrit: 39.8 % (ref 34.0–46.6)
Hemoglobin: 12.9 g/dL (ref 11.1–15.9)
Immature Grans (Abs): 0 x10E3/uL (ref 0.0–0.1)
Immature Granulocytes: 0 %
Lymphocytes Absolute: 1.4 x10E3/uL (ref 0.7–3.1)
Lymphs: 32 %
MCH: 30.5 pg (ref 26.6–33.0)
MCHC: 32.4 g/dL (ref 31.5–35.7)
MCV: 94 fL (ref 79–97)
Monocytes Absolute: 0.4 x10E3/uL (ref 0.1–0.9)
Monocytes: 8 %
Neutrophils Absolute: 2.6 x10E3/uL (ref 1.4–7.0)
Neutrophils: 56 %
Platelets: 190 x10E3/uL (ref 150–450)
RBC: 4.23 x10E6/uL (ref 3.77–5.28)
RDW: 12.1 % (ref 11.7–15.4)
WBC: 4.6 x10E3/uL (ref 3.4–10.8)

## 2024-02-13 LAB — VITAMIN D 25 HYDROXY (VIT D DEFICIENCY, FRACTURES): Vit D, 25-Hydroxy: 39.3 ng/mL (ref 30.0–100.0)

## 2024-02-13 LAB — HEMOGLOBIN A1C
Est. average glucose Bld gHb Est-mCnc: 108 mg/dL
Hgb A1c MFr Bld: 5.4 % (ref 4.8–5.6)

## 2024-02-14 DIAGNOSIS — C50911 Malignant neoplasm of unspecified site of right female breast: Secondary | ICD-10-CM | POA: Diagnosis not present

## 2024-02-17 ENCOUNTER — Ambulatory Visit: Payer: Medicare PPO | Admitting: Oncology

## 2024-02-17 ENCOUNTER — Encounter: Payer: Self-pay | Admitting: *Deleted

## 2024-02-17 NOTE — Progress Notes (Signed)
 Patient was contacted by scheduler to reschedule f/u. She declines to f/u at this time. Last visit 11/17/23 and she is not on therapy.

## 2024-02-18 DIAGNOSIS — Z96611 Presence of right artificial shoulder joint: Secondary | ICD-10-CM | POA: Diagnosis not present

## 2024-02-19 ENCOUNTER — Ambulatory Visit: Admitting: Family

## 2024-02-19 DIAGNOSIS — Z96611 Presence of right artificial shoulder joint: Secondary | ICD-10-CM | POA: Diagnosis not present

## 2024-02-24 ENCOUNTER — Other Ambulatory Visit: Payer: Self-pay | Admitting: Cardiology

## 2024-02-24 DIAGNOSIS — R0781 Pleurodynia: Secondary | ICD-10-CM

## 2024-02-26 ENCOUNTER — Ambulatory Visit: Admitting: Family

## 2024-03-03 DIAGNOSIS — C50911 Malignant neoplasm of unspecified site of right female breast: Secondary | ICD-10-CM | POA: Diagnosis not present

## 2024-03-04 ENCOUNTER — Other Ambulatory Visit: Payer: Self-pay

## 2024-03-04 ENCOUNTER — Encounter: Payer: Self-pay | Admitting: Family

## 2024-03-04 ENCOUNTER — Ambulatory Visit (INDEPENDENT_AMBULATORY_CARE_PROVIDER_SITE_OTHER): Admitting: Family

## 2024-03-04 VITALS — BP 112/82 | HR 80 | Ht 68.0 in | Wt 188.8 lb

## 2024-03-04 DIAGNOSIS — Z0001 Encounter for general adult medical examination with abnormal findings: Secondary | ICD-10-CM | POA: Diagnosis not present

## 2024-03-04 DIAGNOSIS — G720 Drug-induced myopathy: Secondary | ICD-10-CM | POA: Diagnosis not present

## 2024-03-04 DIAGNOSIS — Z0130 Encounter for examination of blood pressure without abnormal findings: Secondary | ICD-10-CM

## 2024-03-04 NOTE — Progress Notes (Signed)
 Annual Wellness Visit  Patient: Megan Rollins, Female    DOB: [birth year], 68 y.o.   MRN: [id number] Visit Date: 03/04/2024  Today's Provider: ALAN CHRISTELLA ARRANT, FNP  Subjective:    Chief Complaint  Patient presents with   Annual Exam   Megan Rollins is a 68 y.o. female who presents today for her Annual Wellness Visit.   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   Pain associated with internal prosthetic device 12/18/2022   Pain in joint of left shoulder 05/06/2019   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   Sprain of left ankle 09/22/2023   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   Past Surgical History:  Procedure Laterality Date   ABDOMINAL HYSTERECTOMY  2005   total   APPENDECTOMY     BREAST BIOPSY Right 06/18/2023   MM RT BREAST BX W LOC DEV 1ST LESION IMAGE BX SPEC STEREO GUIDE 06/18/2023 [facility]   BREAST BIOPSY Right 06/18/2023   MM RT BREAST BX W LOC DEV EA AD LESION IMG BX SPEC STEREO GUIDE 06/18/2023 [facility]   BREAST BIOPSY Right 06/18/2023   MM RT BREAST BX W LOC DEV EA AD LESION IMG BX SPEC STEREO GUIDE 06/18/2023 [facility]   BREAST LUMPECTOMY Left    chemo and radiation 2001   BREAST SURGERY Left 2001   lumpectomy with radiation therapy and chemo therapy   CATARACT EXTRACTION, BILATERAL     CESAREAN SECTION  1984   KIDNEY SURGERY Right    as a child   MASTECTOMY W/ SENTINEL NODE BIOPSY Right 08/06/2023   Procedure: RIGHT MASTECTOMY WITH  SENTINEL LYMPH NODE BIOPSY;  Surgeon: Belinda Cough, MD;  Location: [facility];  Service: General;  Laterality: Right;  LMA   OOPHORECTOMY Bilateral 2005   SHOULDER ARTHROSCOPY WITH OPEN ROTATOR CUFF REPAIR Right 06/18/2019   Procedure: SHOULDER ARTHROSCOPIC ROTATOR CUFF REPAIR, DISTAL CLAVICLE EXCISION, SUBACHROMIAL DECOMPRESSION, INTRA-ARTICULAR DEBRIDEMENT;  Surgeon: Leora Lynwood SAUNDERS, MD;  Location: [facility];  Service: Orthopedics;  Laterality: Right;   SHOULDER ARTHROSCOPY WITH ROTATOR CUFF REPAIR Right 03/21/2020   Procedure: SHOULDER ARTHROSCOPY WITH ROTATOR CUFF REPAIR;  Surgeon: Leora Lynwood SAUNDERS, MD;  Location: [facility];  Service: Orthopedics;  Laterality: Right;   SHOULDER SURGERY  2008   SIMPLE MASTECTOMY WITH AXILLARY SENTINEL NODE BIOPSY Left 08/06/2023   Procedure: LEFT RISK REDUCING MASTECTOMY;  Surgeon: Belinda Cough, MD;  Location: [facility];  Service: General;  Laterality: Left;   TIBIA FRACTURE SURGERY  2014   rod placed   Family History  Problem Relation Age of Onset   Breast cancer Mother 50 - 37   Heart attack Mother    Liver cancer Father 34 - 95   Alcohol abuse Father    Alcohol abuse Sister    Hepatitis Sister    Ovarian cancer Sister 81 - 12   Heart attack Brother  Narcolepsy Brother    Heart attack Brother    Prostate cancer Maternal Uncle 70 - 69       metastatic   Prostate cancer Maternal Uncle 70   Prostate cancer Maternal Uncle 50       metastatic   Kidney cancer Maternal Uncle    Pancreatic cancer Maternal Grandmother 37   Pancreatic cancer Paternal Grandfather 34   Healthy Daughter    Other Son    Stomach cancer Niece 23 - 58   Social History   Socioeconomic History   Marital status: Divorced    Spouse name: Optometrist   Number of children: 2   Years of education: Nature conservation officer education level: Not on file  Occupational History    Employer: [employer]  Tobacco Use   Smoking status:  Former    Types: Cigarettes   Smokeless tobacco: Never  Vaping Use   Vaping status: Never Used  Substance and Sexual Activity   Alcohol use: Not Currently   Drug use: No   Sexual activity: Not Currently    Birth control/protection: Surgical    Comment: hyst  Other Topics Concern   Not on file  Social History Narrative   Lives with roommate in a one story home.  Has 2 children.     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   Social Drivers of Corporate investment banker Strain: Low Risk  (02/14/2023)   Overall Financial Resource Strain (CARDIA)    Difficulty of Paying Living Expenses: Not very hard  Food Insecurity: No Food Insecurity (07/11/2023)   Hunger Vital Sign    Worried About Running Out of Food in the Last Year: Never true    Ran Out of Food in the Last Year: Never true  Transportation Needs: No Transportation Needs (07/11/2023)   PRAPARE - Administrator, Civil Service (Medical): No    Lack of Transportation (Non-Medical): No  Physical Activity: Insufficiently Active (02/14/2023)   Exercise Vital Sign    Days of Exercise per Week: 2 days    Minutes of Exercise per Session: 20 min  Stress: No Stress Concern Present (02/14/2023)   [employer] of Occupational Health - Occupational Stress Questionnaire    Feeling of Stress : Only a little  Social Connections: Unknown (02/14/2023)   Social Connection and Isolation Panel    Frequency of Communication with Friends and Family: Twice a week    Frequency of Social Gatherings with Friends and Family: Once a week    Attends Religious Services: Not on Marketing executive or Organizations: Yes    Attends Banker Meetings: More than 4 times per year    Marital Status: Divorced  Intimate Partner Violence: Not At Risk (07/11/2023)   Humiliation, Afraid, Rape, and Kick questionnaire    Fear of Current or Ex-Partner: No    Emotionally Abused: No    Physically Abused: No     Sexually Abused: No    Medications: Outpatient Medications Prior to Visit  Medication Sig   alendronate  (FOSAMAX ) 70 MG tablet Take 1 tablet (70 mg total) by mouth every 7 (seven) days. Take with a full glass of water on an empty stomach.   Calcium-Cholecalciferol-Zinc 650-20-5.5 MG-MCG-MG CHEW Chew by mouth.   cetirizine (ZYRTEC) 10 MG tablet Take 1 tablet by mouth daily.   citalopram  (CELEXA ) 40 MG tablet Take 1 tablet (40 mg total) by mouth daily.  clonazePAM  (KLONOPIN ) 2 MG tablet Take 1 tablet (2 mg total) by mouth 2 (two) times daily as needed for anxiety.   gabapentin  (NEURONTIN ) 300 MG capsule TAKE 1 CAPSULE BY MOUTH NIGHTLY AT BEDTIME   Multiple Vitamin (MULTIVITAMIN WITH MINERALS) TABS tablet Take 1 tablet by mouth daily.   oxyCODONE  (OXY IR/ROXICODONE ) 5 MG immediate release tablet Take 1 tablet (5 mg total) by mouth every 6 (six) hours as needed for moderate pain (pain score 4-6).   phentermine  (ADIPEX-P ) 37.5 MG tablet Take 1 tablet (37.5 mg total) by mouth daily before breakfast.   No facility-administered medications prior to visit.    Allergies  Allergen Reactions   Clarithromycin Nausea Only and Other (See Comments)    Copper taste   Penicillins Rash   Sulfa Antibiotics Rash    Patient Care Team: Orlean Alan HERO, FNP as PCP - General (Family Medicine) Dessa Reyes ORN, MD (General Surgery)  Review of Systems  Musculoskeletal:  Positive for arthralgias and gait problem.  All other systems reviewed and are negative.     Objective:    Vitals: BP 112/82   Pulse 80   Ht 5' 8 (1.727 m)   Wt 188 lb 12.8 oz (85.6 kg)   SpO2 97%   BMI 28.71 kg/m   Physical Exam Vitals and nursing note reviewed.  Constitutional:      Appearance: Normal appearance. She is normal weight.  HENT:     Head: Normocephalic.  Eyes:     Extraocular Movements: Extraocular movements intact.     Conjunctiva/sclera: Conjunctivae normal.     Pupils: Pupils are equal, round, and  reactive to light.  Cardiovascular:     Rate and Rhythm: Normal rate.  Pulmonary:     Effort: Pulmonary effort is normal.  Musculoskeletal:        General: Normal range of motion.  Neurological:     General: No focal deficit present.     Mental Status: She is alert and oriented to person, place, and time. Mental status is at baseline.  Psychiatric:        Mood and Affect: Mood normal.        Behavior: Behavior normal.        Thought Content: Thought content normal.        Judgment: Judgment normal.      Most recent functional status assessment:    03/04/2024    2:35 PM  In your present state of health, do you have any difficulty performing the following activities:  Hearing? 0  Vision? 0  Difficulty concentrating or making decisions? 0  Walking or climbing stairs? 0  Dressing or bathing? 0  Doing errands, shopping? 0  Preparing Food and eating ? N  Using the Toilet? N  In the past six months, have you accidently leaked urine? N  Do you have problems with loss of bowel control? N  Managing your Medications? N  Managing your Finances? N  Housekeeping or managing your Housekeeping? N    Most recent fall risk assessment:    03/04/2024    2:34 PM  Fall Risk   Falls in the past year? 1     Most recent depression screenings:    07/11/2023    1:46 PM 02/18/2023   11:17 AM  PHQ 2/9 Scores  PHQ - 2 Score 2 1  PHQ- 9 Score 6     Most recent cognitive screening:    03/04/2024    2:35 PM  6CIT Screen  What Year? 0 points  What month? 0 points  What time? 0 points  Count back from 20 0 points  Months in reverse 0 points  Repeat phrase 0 points  Total Score 0 points    No results found for any visits on 03/04/24.     Assessment & Plan:      Annual wellness visit done today including the all of the following: Reviewed patient's Family Medical History Reviewed and updated list of patient's medical providers Assessment of cognitive impairment was done Assessed  patient's functional ability Established a written schedule for health screening services Health Risk Assessent Completed and Reviewed  Exercise Activities and Dietary recommendations  Goals       Activity and Exercise Increased      Evidence-based guidance:  Review current exercise levels.  Assess patient perspective on exercise or activity level, barriers to increasing activity, motivation and readiness for change.  Recommend or set healthy exercise goal based on individual tolerance.  Encourage small steps toward making change in amount of exercise or activity.  Urge reduction of sedentary activities or screen time.  Promote group activities within the community or with family or support person.  Consider referral to rehabiliation therapist for assessment and exercise/activity plan.   Notes:       Disease Progression Minimized or Managed      Evidence-based guidance:  Identify current smoking/tobacco use; provide smoking cessation intervention.  Assess symptom control by the frequency and type of symptoms, reliever use and activity limitation at every encounter.  Assess risk for exacerbation (flare up) by evaluating spirometry, pulse oximetry, reliever use, presentation of symptoms and activity limitation; anticipate treatment adjustment based on risks and resources.  Develop and/or review and reinforce use of COPD rescue (action) plan even when symptoms are controlled or infrequent.  Ask patient to bring inhaler to all visits; assess and reinforce correct technique; address barriers to proper inhaler use, such as older age, use of multiple devices and lack of understanding.   Identify symptom triggers, such as smoking, virus, weather change, emotional upset, exercise, obesity and environmental allergen; consider reduction of work-exposure versus elimination to avoid compromising employment.  Correlate presentation to comorbidity, such as diabetes, heart failure, obstructive sleep apnea,  depression and anxiety, which may worsen symptoms.  Prepare for individualized pharmacologic therapy that may include LABA (long-acting beta-2 agonist), LAMA (long-acting muscarinic antagonist), SABA (short-acting beta-2 agonist) oral or inhaled corticosteroid.  Promote participation in pulmonary rehabilitation for breathing exercises, skills training, improved exercise capacity, mood and quality of life; address barriers to participation.  Promote physical activity or exercise to improve or maintain exercise capacity, based on tolerance that may include walking, water exercise, cycling or limb muscle strength training.  Promote use of energy conservation and activity pacing techniques.  Promote use of breathing and coughing techniques, such as inspiratory muscle training, pursed-lip breathing, diaphragmatic breathing, pranayama yoga breathing or huff cough.  Screen for malnutrition risk factors, such as unintentional weight loss and poor oral intake; refer to dietitian if identified.  Consider recommendation for oral drink supplement or multivitamin and mineral supplements if suspect inadequate oral intake or micronutrient deficiencies.   Screen for obstructive sleep apnea; prepare patient for polysomnography based on risk and presentation.  Prepare patient for use of long-term oxygen and noninvasive ventilation to relieve hypercapnia, hypoxemia, obstructive sleep apnea and reduce work of breathing.  Prepare patient with worsening disease for surgical interventions that may include bronchoscopy, lung volume reduction surgery, bullectomy or lung transplantation.  Notes:       Harm or Injury Prevented      Evidence-based guidance:  Assess for injurious side effects of medication, especially those that may increase fall risk, including psychotropic drugs, such as benzodiazepine, as well as cardiovascular drugs, such as antiarrhythmic, digoxin and   diuretic; consider discontinuation of medications  when appropriate.  Review fall risk screen; assess fall history, gait, balance, mobility, muscle weakness, fear of falling, visual or cognitive impairment, urinary incontinence and home hazards.  Review and address environmental factors, such as poor lighting, loosened or frayed carpets and trailing electrical cables, that may increase the risk of falling.  Provide anticipatory guidance regarding fall risk, as well as physical and environmental barriers to safety; include how to summon help if a fall occurs and how to avoid a delay in treatment.  Consider referral to physical or occupational therapist for evaluation and recommendations for assistive or adaptive devices, orthoses, strength and balance training.  Include patient and family in education concerning fall-prevention measures.   Notes:       Weight (lb) < 160 lb (72.6 kg) (pt-stated)      Continue current meds.  Will adjust as needed based on results.  The patient is asked to make an attempt to improve diet and exercise patterns to aid in medical management of this problem. Addressed importance of increasing and maintaining water intake.          Immunization History  Administered Date(s) Administered   Influenza, Quadrivalent, Recombinant, Inj, Pf 05/03/2019   Influenza,inj,Quad PF,6+ Mos 05/03/2016   Influenza-Unspecified 03/27/2017   Tdap 08/02/2014    Health Maintenance  Topic Date Due   Hepatitis C Screening  Never done   INFLUENZA VACCINE  01/31/2024   Medicare Annual Wellness (AWV)  02/18/2024   DTaP/Tdap/Td (2 - Td or Tdap) 08/02/2024   MAMMOGRAM  07/22/2025   Colonoscopy  04/01/2033   DEXA SCAN  Completed   HPV VACCINES  Aged Out   Meningococcal B Vaccine  Aged Out   Pneumococcal Vaccine: 50+ Years  Discontinued   COVID-19 Vaccine  Discontinued   Zoster Vaccines- Shingrix  Discontinued     Discussed health benefits of physical activity, and encouraged her to engage in regular exercise appropriate for her  age and condition.        ALAN CHRISTELLA ARRANT, FNP   03/04/2024  This document may have been prepared by [facility] Voice Recognition software and as such may include unintentional dictation errors.

## 2024-03-10 ENCOUNTER — Other Ambulatory Visit: Payer: Self-pay | Admitting: Family

## 2024-03-10 ENCOUNTER — Encounter: Payer: Self-pay | Admitting: Family

## 2024-03-16 DIAGNOSIS — Z96611 Presence of right artificial shoulder joint: Secondary | ICD-10-CM | POA: Diagnosis not present

## 2024-03-18 DIAGNOSIS — Z923 Personal history of irradiation: Secondary | ICD-10-CM | POA: Diagnosis not present

## 2024-03-18 DIAGNOSIS — Z853 Personal history of malignant neoplasm of breast: Secondary | ICD-10-CM | POA: Diagnosis not present

## 2024-03-18 DIAGNOSIS — Z08 Encounter for follow-up examination after completed treatment for malignant neoplasm: Secondary | ICD-10-CM | POA: Diagnosis not present

## 2024-04-01 ENCOUNTER — Other Ambulatory Visit: Payer: Self-pay | Admitting: Family

## 2024-04-06 ENCOUNTER — Other Ambulatory Visit: Payer: Self-pay | Admitting: Family

## 2024-04-21 DIAGNOSIS — S90122A Contusion of left lesser toe(s) without damage to nail, initial encounter: Secondary | ICD-10-CM | POA: Diagnosis not present

## 2024-05-04 ENCOUNTER — Telehealth: Payer: Self-pay | Admitting: Family

## 2024-05-04 ENCOUNTER — Encounter: Payer: Self-pay | Admitting: Family

## 2024-05-04 ENCOUNTER — Ambulatory Visit (INDEPENDENT_AMBULATORY_CARE_PROVIDER_SITE_OTHER): Admitting: Family

## 2024-05-04 VITALS — BP 112/72 | HR 84 | Ht 68.0 in | Wt 193.2 lb

## 2024-05-04 DIAGNOSIS — E538 Deficiency of other specified B group vitamins: Secondary | ICD-10-CM | POA: Diagnosis not present

## 2024-05-04 DIAGNOSIS — Z79899 Other long term (current) drug therapy: Secondary | ICD-10-CM

## 2024-05-04 DIAGNOSIS — E559 Vitamin D deficiency, unspecified: Secondary | ICD-10-CM

## 2024-05-04 DIAGNOSIS — F419 Anxiety disorder, unspecified: Secondary | ICD-10-CM | POA: Diagnosis not present

## 2024-05-04 DIAGNOSIS — G2581 Restless legs syndrome: Secondary | ICD-10-CM | POA: Diagnosis not present

## 2024-05-04 DIAGNOSIS — Z0130 Encounter for examination of blood pressure without abnormal findings: Secondary | ICD-10-CM

## 2024-05-04 NOTE — Telephone Encounter (Signed)
 Refills

## 2024-05-05 DIAGNOSIS — G2581 Restless legs syndrome: Secondary | ICD-10-CM | POA: Diagnosis not present

## 2024-05-05 DIAGNOSIS — Z809 Family history of malignant neoplasm, unspecified: Secondary | ICD-10-CM | POA: Diagnosis not present

## 2024-05-05 DIAGNOSIS — F320 Major depressive disorder, single episode, mild: Secondary | ICD-10-CM | POA: Diagnosis not present

## 2024-05-05 DIAGNOSIS — Z9181 History of falling: Secondary | ICD-10-CM | POA: Diagnosis not present

## 2024-05-05 DIAGNOSIS — Z882 Allergy status to sulfonamides status: Secondary | ICD-10-CM | POA: Diagnosis not present

## 2024-05-05 DIAGNOSIS — M810 Age-related osteoporosis without current pathological fracture: Secondary | ICD-10-CM | POA: Diagnosis not present

## 2024-05-05 DIAGNOSIS — Z880 Allergy status to penicillin: Secondary | ICD-10-CM | POA: Diagnosis not present

## 2024-05-05 DIAGNOSIS — Z87891 Personal history of nicotine dependence: Secondary | ICD-10-CM | POA: Diagnosis not present

## 2024-05-05 DIAGNOSIS — F419 Anxiety disorder, unspecified: Secondary | ICD-10-CM | POA: Diagnosis not present

## 2024-05-06 ENCOUNTER — Ambulatory Visit: Payer: Self-pay | Admitting: Family

## 2024-05-06 LAB — TOXASSURE SELECT 13 (MW), URINE

## 2024-05-06 NOTE — Progress Notes (Signed)
Pt informed

## 2024-05-06 NOTE — Progress Notes (Signed)
 "  Established Patient Office Visit  Subjective:  Patient ID: Megan Rollins, female    DOB: [birth year]  Age: 68 y.o. MRN: [id number]  Chief Complaint  Patient presents with   Follow-up    Medication refills    Patient is here today for her 3 months follow up.  She has been feeling fairly well since last appointment.   She does not have additional concerns to discuss today.  Labs are not due today.  She needs refills.   I have reviewed her active problem list, medication list, allergies, notes from last encounter, lab results for her appointment today.      No other concerns at this time.   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   Pain associated with internal prosthetic device 12/18/2022   Pain in joint of left shoulder 05/06/2019   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   Sprain of left ankle 09/22/2023   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    Past Surgical History:  Procedure Laterality Date   ABDOMINAL HYSTERECTOMY  2005   total   APPENDECTOMY     BREAST BIOPSY Right 06/18/2023   MM RT BREAST BX W LOC DEV 1ST LESION IMAGE BX SPEC STEREO GUIDE 06/18/2023 [facility]   BREAST BIOPSY Right 06/18/2023   MM RT BREAST BX W LOC DEV EA AD LESION IMG BX SPEC STEREO GUIDE 06/18/2023 [facility]   BREAST BIOPSY Right 06/18/2023   MM RT BREAST BX W LOC DEV EA AD LESION IMG BX SPEC STEREO GUIDE 06/18/2023 [facility]   BREAST LUMPECTOMY Left    chemo and  radiation 2001   BREAST SURGERY Left 2001   lumpectomy with radiation therapy and chemo therapy   CATARACT EXTRACTION, BILATERAL     CESAREAN SECTION  1984   KIDNEY SURGERY Right    as a child   MASTECTOMY W/ SENTINEL NODE BIOPSY Right 08/06/2023   Procedure: RIGHT MASTECTOMY WITH SENTINEL LYMPH NODE BIOPSY;  Surgeon: Belinda Cough, MD;  Location: [facility];  Service: General;  Laterality: Right;  LMA   OOPHORECTOMY Bilateral 2005   SHOULDER ARTHROSCOPY WITH OPEN ROTATOR CUFF REPAIR Right 06/18/2019   Procedure: SHOULDER ARTHROSCOPIC ROTATOR CUFF REPAIR, DISTAL CLAVICLE EXCISION, SUBACHROMIAL DECOMPRESSION, INTRA-ARTICULAR DEBRIDEMENT;  Surgeon: Leora Lynwood SAUNDERS, MD;  Location: [facility];  Service: Orthopedics;  Laterality: Right;   SHOULDER ARTHROSCOPY WITH ROTATOR CUFF REPAIR Right 03/21/2020   Procedure: SHOULDER ARTHROSCOPY WITH ROTATOR CUFF REPAIR;  Surgeon: Leora Lynwood SAUNDERS, MD;  Location: [facility];  Service: Orthopedics;  Laterality: Right;   SHOULDER SURGERY  2008   SIMPLE MASTECTOMY WITH AXILLARY SENTINEL NODE BIOPSY Left 08/06/2023   Procedure: LEFT RISK REDUCING MASTECTOMY;  Surgeon: Belinda Cough, MD;  Location: [facility];  Service: General;  Laterality: Left;   TIBIA FRACTURE SURGERY  2014   rod placed    Social History   Socioeconomic History  Marital status: Divorced    Spouse name: Acupuncturist of children: 2   Years of education: [employer] education level: Not on file  Occupational History    Employer: [employer]  Tobacco Use   Smoking status: Former    Types: Cigarettes   Smokeless tobacco: Never  Vaping Use   Vaping status: Never Used  Substance and Sexual Activity   Alcohol use: Not Currently   Drug use: No   Sexual activity: Not Currently    Birth control/protection: Surgical    Comment: hyst  Other Topics Concern   Not on file  Social History Narrative   Lives with roommate in a one  story home.  Has 2 children.     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   Social Drivers of Corporate Investment Banker Strain: Low Risk  (02/14/2023)   Overall Financial Resource Strain (CARDIA)    Difficulty of Paying Living Expenses: Not very hard  Food Insecurity: No Food Insecurity (07/11/2023)   Hunger Vital Sign    Worried About Running Out of Food in the Last Year: Never true    Ran Out of Food in the Last Year: Never true  Transportation Needs: No Transportation Needs (07/11/2023)   PRAPARE - Administrator, Civil Service (Medical): No    Lack of Transportation (Non-Medical): No  Physical Activity: Insufficiently Active (02/14/2023)   Exercise Vital Sign    Days of Exercise per Week: 2 days    Minutes of Exercise per Session: 20 min  Stress: No Stress Concern Present (02/14/2023)   [employer] of Occupational Health - Occupational Stress Questionnaire    Feeling of Stress : Only a little  Social Connections: Unknown (02/14/2023)   Social Connection and Isolation Panel    Frequency of Communication with Friends and Family: Twice a week    Frequency of Social Gatherings with Friends and Family: Once a week    Attends Religious Services: Not on file    Active Member of Clubs or Organizations: Yes    Attends Banker Meetings: More than 4 times per year    Marital Status: Divorced  Intimate Partner Violence: Not At Risk (07/11/2023)   Humiliation, Afraid, Rape, and Kick questionnaire    Fear of Current or Ex-Partner: No    Emotionally Abused: No    Physically Abused: No    Sexually Abused: No    Family History  Problem Relation Age of Onset   Breast cancer Mother 64 - 48   Heart attack Mother    Liver cancer Father 75 - 44   Alcohol abuse Father    Alcohol abuse Sister    Hepatitis Sister    Ovarian cancer Sister 22 - 32   Heart attack Brother    Narcolepsy Brother    Heart attack Brother    Prostate cancer Maternal  Uncle 60 - 69       metastatic   Prostate cancer Maternal Uncle 70   Prostate cancer Maternal Uncle 50       metastatic   Kidney cancer Maternal Uncle    Pancreatic cancer Maternal Grandmother 21   Pancreatic cancer Paternal Grandfather 110   Healthy Daughter    Other Son    Stomach cancer Niece 50 - 19    Allergies  Allergen Reactions   Clarithromycin Nausea Only and Other (See Comments)    Copper taste   Penicillins Rash  Sulfa Antibiotics Rash    Review of Systems  All other systems reviewed and are negative.      Objective:   BP 112/72   Pulse 84   Ht 5' 8 (1.727 m)   Wt 193 lb 3.2 oz (87.6 kg)   SpO2 98%   BMI 29.38 kg/m   Vitals:   05/04/24 1111  BP: 112/72  Pulse: 84  Height: 5' 8 (1.727 m)  Weight: 193 lb 3.2 oz (87.6 kg)  SpO2: 98%  BMI (Calculated): 29.38    Physical Exam Vitals and nursing note reviewed.  Constitutional:      Appearance: Normal appearance. She is normal weight.  HENT:     Head: Normocephalic.  Eyes:     Extraocular Movements: Extraocular movements intact.     Conjunctiva/sclera: Conjunctivae normal.     Pupils: Pupils are equal, round, and reactive to light.  Cardiovascular:     Rate and Rhythm: Normal rate.  Pulmonary:     Effort: Pulmonary effort is normal.  Neurological:     General: No focal deficit present.     Mental Status: She is alert and oriented to person, place, and time. Mental status is at baseline.  Psychiatric:        Mood and Affect: Mood normal.        Behavior: Behavior normal.        Thought Content: Thought content normal.      No results found for any visits on 05/04/24.  Recent Results (from the past 2160 hours)  Hemoglobin A1c     Status: None   Collection Time: 02/12/24  8:06 AM  Result Value Ref Range   Hgb A1c MFr Bld 5.4 4.8 - 5.6 %    Comment:          Prediabetes: 5.7 - 6.4          Diabetes: >6.4          Glycemic control for adults with diabetes: <7.0    Est. average  glucose Bld gHb Est-mCnc 108 mg/dL  TSH     Status: None   Collection Time: 02/12/24  8:06 AM  Result Value Ref Range   TSH 2.770 0.450 - 4.500 uIU/mL  CBC With Diff/Platelet     Status: None   Collection Time: 02/12/24  8:06 AM  Result Value Ref Range   WBC 4.6 3.4 - 10.8 x10E3/uL   RBC 4.23 3.77 - 5.28 x10E6/uL   Hemoglobin 12.9 11.1 - 15.9 g/dL   Hematocrit 60.1 65.9 - 46.6 %   MCV 94 79 - 97 fL   MCH 30.5 26.6 - 33.0 pg   MCHC 32.4 31.5 - 35.7 g/dL   RDW 87.8 88.2 - 84.5 %   Platelets 190 150 - 450 x10E3/uL   Neutrophils 56 Not Estab. %   Lymphs 32 Not Estab. %   Monocytes 8 Not Estab. %   Eos 3 Not Estab. %   Basos 1 Not Estab. %   Neutrophils Absolute 2.6 1.4 - 7.0 x10E3/uL   Lymphocytes Absolute 1.4 0.7 - 3.1 x10E3/uL   Monocytes Absolute 0.4 0.1 - 0.9 x10E3/uL   EOS (ABSOLUTE) 0.2 0.0 - 0.4 x10E3/uL   Basophils Absolute 0.0 0.0 - 0.2 x10E3/uL   Immature Granulocytes 0 Not Estab. %   Immature Grans (Abs) 0.0 0.0 - 0.1 x10E3/uL  Lipid panel     Status: Abnormal   Collection Time: 02/12/24  8:06 AM  Result Value Ref Range   Cholesterol,  Total 195 100 - 199 mg/dL   Triglycerides 78 0 - 149 mg/dL   HDL 65 >60 mg/dL   VLDL Cholesterol Cal 14 5 - 40 mg/dL   LDL Chol Calc (NIH) 883 (H) 0 - 99 mg/dL   Chol/HDL Ratio 3.0 0.0 - 4.4 ratio    Comment:                                   T. Chol/HDL Ratio                                             Men  Women                               1/2 Avg.Risk  3.4    3.3                                   Avg.Risk  5.0    4.4                                2X Avg.Risk  9.6    7.1                                3X Avg.Risk 23.4   11.0   CMP14+EGFR     Status: None   Collection Time: 02/12/24  8:06 AM  Result Value Ref Range   Glucose 88 70 - 99 mg/dL   BUN 15 8 - 27 mg/dL   Creatinine, Ser 9.18 0.57 - 1.00 mg/dL   eGFR 80 >40 fO/fpw/8.26   BUN/Creatinine Ratio 19 12 - 28   Sodium 138 134 - 144 mmol/L   Potassium 4.3 3.5 - 5.2  mmol/L   Chloride 100 96 - 106 mmol/L   CO2 24 20 - 29 mmol/L   Calcium 9.1 8.7 - 10.3 mg/dL   Total Protein 6.4 6.0 - 8.5 g/dL   Albumin 4.3 3.9 - 4.9 g/dL   Globulin, Total 2.1 1.5 - 4.5 g/dL   Bilirubin Total 0.4 0.0 - 1.2 mg/dL   Alkaline Phosphatase 90 44 - 121 IU/L   AST 15 0 - 40 IU/L   ALT 15 0 - 32 IU/L  VITAMIN D  25 Hydroxy (Vit-D Deficiency, Fractures)     Status: None   Collection Time: 02/12/24  8:06 AM  Result Value Ref Range   Vit D, 25-Hydroxy 39.3 30.0 - 100.0 ng/mL    Comment: Vitamin D  deficiency has been defined by the Institute of Medicine and an Endocrine Society practice guideline as a level of serum 25-OH vitamin D  less than 20 ng/mL (1,2). The Endocrine Society went on to further define vitamin D  insufficiency as a level between 21 and 29 ng/mL (2). 1. IOM (Institute of Medicine). 2010. Dietary reference    intakes for calcium and D. [place]: The    [employer]. 2. Holick MF, Binkley Fulton, Bischoff-Ferrari HA, et al.    Evaluation, treatment, and prevention of vitamin D     deficiency: an Endocrine Society clinical practice  guideline. JCEM. 2011 Jul; 96(7):1911-30.   Vitamin B12     Status: None   Collection Time: 02/12/24  8:06 AM  Result Value Ref Range   Vitamin B-12 436 232 - 1,245 pg/mL       Assessment & Plan:   Assessment & Plan Long term current use of therapeutic drug Anxiety Patient stable.  Well controlled with current therapy.   Continue current meds.   Vitamin D  deficiency Vitamin B12 deficiency Will continue supplements as needed.   Restless legs Patient stable.  Well controlled with current therapy.   Continue current meds.      No follow-ups on file.   Total time spent: 20 minutes  ALAN CHRISTELLA ARRANT, FNP  05/04/2024   This document may have been prepared by [facility] Voice Recognition software and as such may include unintentional dictation errors.  "

## 2024-05-08 ENCOUNTER — Other Ambulatory Visit: Payer: Self-pay | Admitting: Family

## 2024-05-08 MED ORDER — TIZANIDINE HCL 2 MG PO TABS: 2.0000 mg | ORAL_TABLET | Freq: Every day | ORAL | 2 refills | Status: DC

## 2024-05-12 ENCOUNTER — Other Ambulatory Visit: Payer: Self-pay | Admitting: Family

## 2024-05-13 MED ORDER — CLONAZEPAM 2 MG PO TABS: 2.0000 mg | ORAL_TABLET | Freq: Two times a day (BID) | ORAL | 2 refills | Status: AC | PRN

## 2024-05-20 DIAGNOSIS — Z923 Personal history of irradiation: Secondary | ICD-10-CM | POA: Diagnosis not present

## 2024-05-20 DIAGNOSIS — Z853 Personal history of malignant neoplasm of breast: Secondary | ICD-10-CM | POA: Diagnosis not present

## 2024-05-20 NOTE — Progress Notes (Signed)
 Surgical Instructions   Your procedure is scheduled on June 01, 2024. Report to [facility] Main Entrance A at 5:30 A.M., then check in with the Admitting office. Any questions or running late day of surgery: call [phone]  Questions prior to your surgery date: call [phone], Monday-Friday, 8am-4pm. If you experience any cold or flu symptoms such as cough, fever, chills, shortness of breath, etc. between now and your scheduled surgery, please notify us  at the above number.     Remember:  Do not eat after midnight the night before your surgery   You may drink clear liquids until 4:30 the morning of your surgery.   Clear liquids allowed are: Water, Non-Citrus Juices (without pulp), Carbonated Beverages, Clear Tea (no milk, honey, etc.), Black Coffee Only (NO MILK, CREAM OR POWDERED CREAMER of any kind), and Gatorade.    Take these medicines the morning of surgery with A SIP OF WATER  cetirizine (ZYRTEC)   May take these medicines IF NEEDED: clonazePAM  (KLONOPIN )    One week prior to surgery, STOP taking any Aspirin (unless otherwise instructed by your surgeon) Aleve, Naproxen, Ibuprofen, Motrin, Advil, Goody's, BC's, all herbal medications, fish oil, and non-prescription vitamins.                     Do NOT Smoke (Tobacco/Vaping) for 24 hours prior to your procedure.  If you use a CPAP at night, you may bring your mask/headgear for your overnight stay.   You will be asked to remove any contacts, glasses, piercing's, hearing aid's, dentures/partials prior to surgery. Please bring cases for these items if needed.    Patients discharged the day of surgery will not be allowed to drive home, and someone needs to stay with them for 24 hours.  SURGICAL WAITING ROOM VISITATION Patients may have no more than 2 support people in the waiting area - these visitors may rotate.   Pre-op nurse will coordinate an appropriate time for 1 ADULT support person, who may not rotate, to  accompany patient in pre-op.  Children under the age of 59 must have an adult with them who is not the patient and must remain in the main waiting area with an adult.  If the patient needs to stay at the hospital during part of their recovery, the visitor guidelines for inpatient rooms apply.  Please refer to the [facility] website for the visitor guidelines for any additional information.   If you received a COVID test during your pre-op visit  it is requested that you wear a mask when out in public, stay away from anyone that may not be feeling well and notify your surgeon if you develop symptoms. If you have been in contact with anyone that has tested positive in the last 10 days please notify you surgeon.      Pre-operative CHG Bathing Instructions   You can play a key role in reducing the risk of infection after surgery. Your skin needs to be as free of germs as possible. You can reduce the number of germs on your skin by washing with CHG (chlorhexidine  gluconate) soap before surgery. CHG is an antiseptic soap that kills germs and continues to kill germs even after washing.   DO NOT use if you have an allergy to chlorhexidine /CHG or antibacterial soaps. If your skin becomes reddened or irritated, stop using the CHG and notify one of our RNs at [phone].  TAKE A SHOWER THE NIGHT BEFORE SURGERY   Please keep in mind the following:  DO NOT shave, including legs and underarms, 48 hours prior to surgery.   You may shave your face before/day of surgery.  Place clean sheets on your bed the night before surgery Use a clean washcloth (not used since being washed) for shower. DO NOT sleep with pet's night before surgery.  CHG Shower Instructions:  Wash your face and private area with normal soap. If you choose to wash your hair, wash first with your normal shampoo.  After you use shampoo/soap, rinse your hair and body thoroughly to remove shampoo/soap residue.  Turn the  water OFF and apply half the bottle of CHG soap to a CLEAN washcloth.  Apply CHG soap ONLY FROM YOUR NECK DOWN TO YOUR TOES (washing for 3-5 minutes)  DO NOT use CHG soap on face, private areas, open wounds, or sores.  Pay special attention to the area where your surgery is being performed.  If you are having back surgery, having someone wash your back for you may be helpful. Wait 2 minutes after CHG soap is applied, then you may rinse off the CHG soap.  Pat dry with a clean towel  Put on clean pajamas    Additional instructions for the day of surgery: If you choose, you may shower the morning of surgery with an antibacterial soap.  DO NOT APPLY any lotions, deodorants, cologne, or perfumes.   Do not wear jewelry or makeup Do not wear nail polish, gel polish, artificial nails, or any other type of covering on natural nails (fingers and toes) Do not bring valuables to the hospital. [facility] is not responsible for valuables/personal belongings. Put on clean/comfortable clothes.  Please brush your teeth.  Ask your nurse before applying any prescription medications to the skin.

## 2024-05-20 NOTE — H&P (Signed)
 Subjective Patient ID: Megan Rollins is a 68 y.o. female.   HPI   Returns for follow discussion prior to planned bilateral breast reconstruction. Reports has moved into daughter's house for surgery and to recover there. Grandchild currently being treated for Staph infection hand.      Presented following screening MMG with right breast calcifications. Diagnostic MMG showed broad area of calcifications, spanning 6 cm in AP orientation and medial to lateral 3.4 cm. Biopsies labeled right breast posterior and right breast media showed IDC with high grade DCIS, right breast anterior demonstrated high grade DCIS suspicious for invasion. PR-, ER report noted Small focus of carcinoma in situ is positive for ER, the invasive carcinoma and the rest of DCIS components are negative for ER. Her2 negative by FISH.   Diagnosed in 2001 with left breast cancer. She underwent lumpectomy SLN with adjuvant chemotherapy and radiation.   Mastectomy recommended given span of disease. Patient elected for bilateral mastectomies. Final pathology right breast no residual invasive carcinoma, DCIS present, margins clear, 0/4 SLN   Mother with hx breast ca. Reports negative BRCA testing in past.   Prion DD. Right mastectomy 975 g Left mastectomy 489 g   Patient's history significant for right total shoulder arthroplasty and  has weakness this side compared with LUE. Also reports kidney surgery as infant and muscle removed during that surgery.   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 She experienced fall at work August 2025. Notes she was seen by her Orthopedist and shoulder hardware in tact but kept in sling over RUE for 4 weeks to offload muscles. She has also retired again from work.    Lives with friend- states she rents a room in her friend's home and this friend would assist with post op care. 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   Review of Systems    Objective Physical Exam  Cardiovascular: Normal rate, regular rhythm and normal heart sounds.    Pulmonary/Chest Effort normal and breath sounds normal.       Chest: bilateral breasts absent bilateral seroma present without cellulitis Redundant skin mastectomy bilateral CW 14 cm No masses   Axillae benign   Abd sufficient soft tissue for reconstruction low transverse scar present (C section and kidney surgery per patient) Palpable rectus abdominus muscle bilateral   Assessment/Plan History left breast cancer S/p lumpectomy chemotherapy radiation Right breast cancer S/p Right mastectomy SLN, Left simple mastectomy   Plan bilateral TE reconstruction, left latissimus dorsi flap, acellular dermis to right chest.   Per review patient chart, history osteoradionecrosis left chest.   Given her history radiation, reviewed this will significantly increase her risks of reconstruction including wound healing problems and contracture. Autologous tissue would reduce these risks back to baseline. Discussed in setting implant based reconstruction, recommend latissimus flap with expander over left. Discussed donor site incision, function latissimus, drains. Alternative would be consultation microsurgeon. Counseled purely autologous reconstruction would likely giver her the most symmetry. Counseled even with LD flap, implant based reconstruction over radiated chest will be higher on chest and rounder appearance than opposite reconstruction.     In past appointments, patient expressed concern with weakening her LUE with LD flap. She also expressed concerns with foreign bodies and reports only allowed shoulder replacement after two failed rotator cuff repairs and significant pain. Patient continues to decline referral to discuss DIEP.    Over right chest plan use acellular dermis. Discussed use of acellular dermis in reconstruction,  cadaveric source, incorporation over several weeks, risk that if has  seroma or infection can act as additional nidus for infection if not incorporated. Reviewed this is off label use of ADM.   Over right chest, discussed prepectoral vs sub pectoral reconstruction. Discussed with patient and benefit of this is no animation deformity, may be less pain. Risk may be more visible rippling over upper poles, greater need of ADM. Reviewed pre pectoral would require larger amount acellular dermis, more drains. Discussed any type reconstruction also risks long term displacement implant and visible rippling. If prepectoral counseled I would recommend she be comfortable with silicone implants as more options that have less rippling. She agrees to prepectoral placement over right chest.   Additional risks including but not limited to bleeding, hematoma, seroma, damage to adjacent structures, infection, need for additional surgeries, unacceptable cosmetic result, blood clots in legs or lungs, failure flap discussed.   Rx for Norco, Robaxin , and doxycycline given.   Earlis Ranks, MD [facility]  Office/ physician access line after hours [phone]

## 2024-05-21 ENCOUNTER — Encounter (HOSPITAL_COMMUNITY)
Admission: RE | Admit: 2024-05-21 | Discharge: 2024-05-21 | Disposition: A | Discharge status: Home / Self Care | Source: Ambulatory Visit | Attending: Plastic Surgery | Admitting: Plastic Surgery

## 2024-05-21 ENCOUNTER — Other Ambulatory Visit: Payer: Self-pay

## 2024-05-21 ENCOUNTER — Encounter (HOSPITAL_COMMUNITY): Payer: Self-pay

## 2024-05-21 VITALS — BP 92/72 | HR 91 | Temp 98.4°F | Resp 18 | Ht 68.0 in | Wt 188.4 lb

## 2024-05-21 DIAGNOSIS — I10 Essential (primary) hypertension: Secondary | ICD-10-CM | POA: Diagnosis not present

## 2024-05-21 DIAGNOSIS — Z01812 Encounter for preprocedural laboratory examination: Secondary | ICD-10-CM | POA: Diagnosis present

## 2024-05-21 DIAGNOSIS — Z01818 Encounter for other preprocedural examination: Secondary | ICD-10-CM

## 2024-05-21 LAB — CBC
HCT: 42.4 % (ref 36.0–46.0)
Hemoglobin: 14 g/dL (ref 12.0–15.0)
MCH: 30.8 pg (ref 26.0–34.0)
MCHC: 33 g/dL (ref 30.0–36.0)
MCV: 93.4 fL (ref 80.0–100.0)
Platelets: 196 K/uL (ref 150–400)
RBC: 4.54 MIL/uL (ref 3.87–5.11)
RDW: 12.6 % (ref 11.5–15.5)
WBC: 5.7 K/uL (ref 4.0–10.5)
nRBC: 0 % (ref 0.0–0.2)

## 2024-05-21 NOTE — Progress Notes (Signed)
 PCP - Alan Arrant NP Cardiologist - denies  PPM/ICD - denies Device Orders - n/a Rep Notified - n/a  Chest x-ray - n/a EKG - n/a Stress Test - 09-27-15  ECHO - 10-12-15  Cardiac Cath - denies  Sleep Study - denies CPAP - n/a  NON-diabetic  Last dose of GLP1 agonist-  denies GLP1 instructions: n/a  Blood Thinner Instructions: denies Aspirin Instructions:denies  ERAS Protcol - clears until 0430 PRE-SURGERY Ensure or G2- none  COVID TEST- n/a   Anesthesia review: no  Patient denies shortness of breath, fever, cough and chest pain at PAT appointment. Patient denies any respiratory issues at this time.    All instructions explained to the patient, with a verbal understanding of the material. Patient agrees to go over the instructions while at home for a better understanding. Patient also instructed to self quarantine after being tested for COVID-19. The opportunity to ask questions was provided.

## 2024-06-01 ENCOUNTER — Inpatient Hospital Stay (HOSPITAL_COMMUNITY): Payer: Self-pay

## 2024-06-01 ENCOUNTER — Encounter (HOSPITAL_COMMUNITY): Payer: Self-pay | Admitting: Plastic Surgery

## 2024-06-01 ENCOUNTER — Other Ambulatory Visit: Payer: Self-pay

## 2024-06-01 ENCOUNTER — Encounter (HOSPITAL_COMMUNITY): Admission: RE | Disposition: A | Payer: Self-pay | Source: Home / Self Care | Attending: Plastic Surgery

## 2024-06-01 ENCOUNTER — Inpatient Hospital Stay (HOSPITAL_COMMUNITY)
Admission: RE | Admit: 2024-06-01 | Discharge: 2024-06-02 | DRG: 578 | Disposition: A | Attending: Plastic Surgery | Admitting: Plastic Surgery

## 2024-06-01 ENCOUNTER — Encounter (HOSPITAL_COMMUNITY): Payer: Self-pay

## 2024-06-01 DIAGNOSIS — Z421 Encounter for breast reconstruction following mastectomy: Secondary | ICD-10-CM

## 2024-06-01 DIAGNOSIS — Z853 Personal history of malignant neoplasm of breast: Secondary | ICD-10-CM

## 2024-06-01 DIAGNOSIS — Z803 Family history of malignant neoplasm of breast: Secondary | ICD-10-CM

## 2024-06-01 DIAGNOSIS — Z923 Personal history of irradiation: Secondary | ICD-10-CM

## 2024-06-01 DIAGNOSIS — Z87891 Personal history of nicotine dependence: Secondary | ICD-10-CM

## 2024-06-01 DIAGNOSIS — Z882 Allergy status to sulfonamides status: Secondary | ICD-10-CM

## 2024-06-01 DIAGNOSIS — Z96611 Presence of right artificial shoulder joint: Secondary | ICD-10-CM | POA: Diagnosis present

## 2024-06-01 DIAGNOSIS — Z881 Allergy status to other antibiotic agents status: Secondary | ICD-10-CM

## 2024-06-01 DIAGNOSIS — Z880 Allergy status to penicillin: Secondary | ICD-10-CM

## 2024-06-01 DIAGNOSIS — Z79899 Other long term (current) drug therapy: Secondary | ICD-10-CM

## 2024-06-01 DIAGNOSIS — Z9013 Acquired absence of bilateral breasts and nipples: Secondary | ICD-10-CM

## 2024-06-01 DIAGNOSIS — Z9221 Personal history of antineoplastic chemotherapy: Secondary | ICD-10-CM

## 2024-06-01 SURGERY — RECONSTRUCTION, BREAST
Anesthesia: General | Site: Chest | Laterality: Right

## 2024-06-01 MED ORDER — KETAMINE HCL 50 MG/5ML IJ SOSY
PREFILLED_SYRINGE | INTRAMUSCULAR | Status: AC
  Filled 2024-06-01: qty 5

## 2024-06-01 MED ORDER — PROPOFOL 10 MG/ML IV BOLUS
INTRAVENOUS | Status: DC | PRN
  Administered 2024-06-01: 150 mg via INTRAVENOUS

## 2024-06-01 MED ORDER — FENTANYL CITRATE (PF) 100 MCG/2ML IJ SOLN
INTRAMUSCULAR | Status: AC
  Filled 2024-06-01: qty 2

## 2024-06-01 MED ORDER — GLYCOPYRROLATE PF 0.2 MG/ML IJ SOSY
PREFILLED_SYRINGE | INTRAMUSCULAR | Status: AC
  Filled 2024-06-01: qty 1

## 2024-06-01 MED ORDER — HYDROMORPHONE HCL 1 MG/ML IJ SOLN
INTRAMUSCULAR | Status: AC
  Filled 2024-06-01: qty 0.5

## 2024-06-01 MED ORDER — LIDOCAINE 2% (20 MG/ML) 5 ML SYRINGE
INTRAMUSCULAR | Status: AC
  Filled 2024-06-01: qty 5

## 2024-06-01 MED ORDER — HYDROCODONE-ACETAMINOPHEN 5-325 MG PO TABS
1.0000 | ORAL_TABLET | ORAL | Status: DC | PRN
  Administered 2024-06-01: 2 via ORAL
  Filled 2024-06-01: qty 2

## 2024-06-01 MED ORDER — FENTANYL CITRATE (PF) 250 MCG/5ML IJ SOLN
INTRAMUSCULAR | Status: DC | PRN
  Administered 2024-06-01: 100 ug via INTRAVENOUS

## 2024-06-01 MED ORDER — ONDANSETRON 4 MG PO TBDP: 4.0000 mg | ORAL_TABLET | Freq: Four times a day (QID) | ORAL | Status: DC | PRN

## 2024-06-01 MED ORDER — KETOROLAC TROMETHAMINE 15 MG/ML IJ SOLN
15.0000 mg | Freq: Three times a day (TID) | INTRAMUSCULAR | Status: AC
  Administered 2024-06-01 – 2024-06-02 (×3): 15 mg via INTRAVENOUS
  Filled 2024-06-01 (×3): qty 1

## 2024-06-01 MED ORDER — AMISULPRIDE (ANTIEMETIC) 5 MG/2ML IV SOLN: 10.0000 mg | Freq: Once | INTRAVENOUS | Status: DC | PRN

## 2024-06-01 MED ORDER — ROCURONIUM BROMIDE 10 MG/ML (PF) SYRINGE
PREFILLED_SYRINGE | INTRAVENOUS | Status: AC
  Filled 2024-06-01: qty 10

## 2024-06-01 MED ORDER — ENOXAPARIN SODIUM 40 MG/0.4ML IJ SOSY: 40.0000 mg | PREFILLED_SYRINGE | INTRAMUSCULAR | Status: DC

## 2024-06-01 MED ORDER — ONDANSETRON HCL 4 MG/2ML IJ SOLN: 4.0000 mg | Freq: Four times a day (QID) | INTRAMUSCULAR | Status: DC | PRN

## 2024-06-01 MED ORDER — ROCURONIUM BROMIDE 10 MG/ML (PF) SYRINGE
PREFILLED_SYRINGE | INTRAVENOUS | Status: DC | PRN
  Administered 2024-06-01: 80 mg via INTRAVENOUS

## 2024-06-01 MED ORDER — BUPIVACAINE-EPINEPHRINE (PF) 0.25% -1:200000 IJ SOLN
INTRAMUSCULAR | Status: AC
  Filled 2024-06-01: qty 60

## 2024-06-01 MED ORDER — HEPARIN SODIUM (PORCINE) 5000 UNIT/ML IJ SOLN
5000.0000 [IU] | Freq: Once | INTRAMUSCULAR | Status: AC
  Administered 2024-06-01: 5000 [IU] via SUBCUTANEOUS
  Filled 2024-06-01: qty 1

## 2024-06-01 MED ORDER — OXYCODONE HCL 5 MG PO TABS: 5.0000 mg | ORAL_TABLET | Freq: Once | ORAL | Status: DC | PRN

## 2024-06-01 MED ORDER — PROPOFOL 10 MG/ML IV BOLUS
INTRAVENOUS | Status: AC
  Filled 2024-06-01: qty 20

## 2024-06-01 MED ORDER — SUGAMMADEX SODIUM 200 MG/2ML IV SOLN
INTRAVENOUS | Status: DC | PRN
  Administered 2024-06-01: 200 mg via INTRAVENOUS

## 2024-06-01 MED ORDER — PROPOFOL 500 MG/50ML IV EMUL
INTRAVENOUS | Status: DC | PRN
  Administered 2024-06-01: 130 ug/kg/min via INTRAVENOUS
  Administered 2024-06-01 (×2): 125 ug/kg/min via INTRAVENOUS

## 2024-06-01 MED ORDER — HYDROMORPHONE HCL 1 MG/ML IJ SOLN: 0.2500 mg | INTRAMUSCULAR | Status: DC | PRN

## 2024-06-01 MED ORDER — 0.9 % SODIUM CHLORIDE (POUR BTL) OPTIME
TOPICAL | Status: DC | PRN
  Administered 2024-06-01: 2000 mL

## 2024-06-01 MED ORDER — KETAMINE HCL 10 MG/ML IJ SOLN
INTRAMUSCULAR | Status: DC | PRN
  Administered 2024-06-01: 30 mg via INTRAVENOUS
  Administered 2024-06-01: 20 mg via INTRAVENOUS

## 2024-06-01 MED ORDER — CEFAZOLIN SODIUM-DEXTROSE 2-4 GM/100ML-% IV SOLN
2.0000 g | INTRAVENOUS | Status: AC
  Administered 2024-06-01: 2 g via INTRAVENOUS
  Filled 2024-06-01: qty 100

## 2024-06-01 MED ORDER — TIZANIDINE HCL 2 MG PO TABS
2.0000 mg | ORAL_TABLET | Freq: Every day | ORAL | Status: DC
  Administered 2024-06-01: 2 mg via ORAL
  Filled 2024-06-01: qty 1

## 2024-06-01 MED ORDER — CITALOPRAM HYDROBROMIDE 20 MG PO TABS
40.0000 mg | ORAL_TABLET | Freq: Every day | ORAL | Status: DC
  Administered 2024-06-01: 40 mg via ORAL
  Filled 2024-06-01: qty 2

## 2024-06-01 MED ORDER — BUPIVACAINE-EPINEPHRINE 0.25% -1:200000 IJ SOLN
INTRAMUSCULAR | Status: DC | PRN
  Administered 2024-06-01 (×3): 15 mL

## 2024-06-01 MED ORDER — MIDAZOLAM HCL 2 MG/2ML IJ SOLN
INTRAMUSCULAR | Status: AC
  Filled 2024-06-01: qty 2

## 2024-06-01 MED ORDER — CHLORHEXIDINE GLUCONATE CLOTH 2 % EX PADS
6.0000 | MEDICATED_PAD | Freq: Once | CUTANEOUS | Status: AC
  Administered 2024-06-01: 6 via TOPICAL

## 2024-06-01 MED ORDER — LACTATED RINGERS IV SOLN: INTRAVENOUS | Status: DC

## 2024-06-01 MED ORDER — SODIUM CHLORIDE 0.9 % IV SOLN
INTRAVENOUS | Status: DC | PRN
  Administered 2024-06-01: 500 mL

## 2024-06-01 MED ORDER — DEXAMETHASONE SOD PHOSPHATE PF 10 MG/ML IJ SOLN
INTRAMUSCULAR | Status: DC | PRN
  Administered 2024-06-01: 10 mg via INTRAVENOUS

## 2024-06-01 MED ORDER — PHENYLEPHRINE HCL-NACL 20-0.9 MG/250ML-% IV SOLN
INTRAVENOUS | Status: AC
  Filled 2024-06-01: qty 500

## 2024-06-01 MED ORDER — HYDROMORPHONE HCL 1 MG/ML IJ SOLN
0.5000 mg | INTRAMUSCULAR | Status: DC | PRN
  Administered 2024-06-01: 0.5 mg via INTRAVENOUS
  Filled 2024-06-01: qty 0.5

## 2024-06-01 MED ORDER — CHLORHEXIDINE GLUCONATE 0.12 % MT SOLN
15.0000 mL | Freq: Once | OROMUCOSAL | Status: AC
  Administered 2024-06-01: 15 mL via OROMUCOSAL
  Filled 2024-06-01: qty 15

## 2024-06-01 MED ORDER — MEPERIDINE HCL 25 MG/ML IJ SOLN: 6.2500 mg | INTRAMUSCULAR | Status: DC | PRN

## 2024-06-01 MED ORDER — HYDROMORPHONE HCL 1 MG/ML IJ SOLN
INTRAMUSCULAR | Status: DC | PRN
  Administered 2024-06-01: .25 mg via INTRAVENOUS

## 2024-06-01 MED ORDER — KCL IN DEXTROSE-NACL 20-5-0.45 MEQ/L-%-% IV SOLN
INTRAVENOUS | Status: DC
  Filled 2024-06-01 (×2): qty 1000

## 2024-06-01 MED ORDER — ORAL CARE MOUTH RINSE: 15.0000 mL | Freq: Once | OROMUCOSAL | Status: AC

## 2024-06-01 MED ORDER — GLYCOPYRROLATE 0.2 MG/ML IJ SOLN
INTRAMUSCULAR | Status: DC | PRN
  Administered 2024-06-01: .2 mg via INTRAVENOUS

## 2024-06-01 MED ORDER — CLONAZEPAM 0.5 MG PO TABS: 2.0000 mg | ORAL_TABLET | Freq: Two times a day (BID) | ORAL | Status: DC | PRN

## 2024-06-01 MED ORDER — ONDANSETRON HCL 4 MG/2ML IJ SOLN
INTRAMUSCULAR | Status: DC | PRN
  Administered 2024-06-01: 4 mg via INTRAVENOUS

## 2024-06-01 MED ORDER — LIDOCAINE 2% (20 MG/ML) 5 ML SYRINGE
INTRAMUSCULAR | Status: DC | PRN
  Administered 2024-06-01: 80 mg via INTRAVENOUS

## 2024-06-01 MED ORDER — OXYCODONE HCL 5 MG/5ML PO SOLN: 5.0000 mg | Freq: Once | ORAL | Status: DC | PRN

## 2024-06-01 MED ORDER — MIDAZOLAM HCL (PF) 2 MG/2ML IJ SOLN
INTRAMUSCULAR | Status: DC | PRN
  Administered 2024-06-01: 2 mg via INTRAVENOUS

## 2024-06-01 MED ORDER — ONDANSETRON HCL 4 MG/2ML IJ SOLN
INTRAMUSCULAR | Status: AC
  Filled 2024-06-01: qty 2

## 2024-06-01 MED ORDER — CEFAZOLIN SODIUM-DEXTROSE 1-4 GM/50ML-% IV SOLN
1.0000 g | Freq: Three times a day (TID) | INTRAVENOUS | Status: DC
  Administered 2024-06-01 (×2): 1 g via INTRAVENOUS
  Filled 2024-06-01 (×3): qty 50

## 2024-06-01 MED ORDER — ACETAMINOPHEN 500 MG PO TABS
1000.0000 mg | ORAL_TABLET | ORAL | Status: AC
  Administered 2024-06-01: 1000 mg via ORAL
  Filled 2024-06-01: qty 2

## 2024-06-01 MED ORDER — PROPOFOL 1000 MG/100ML IV EMUL
INTRAVENOUS | Status: AC
  Filled 2024-06-01: qty 400

## 2024-06-01 MED ORDER — CHLORHEXIDINE GLUCONATE CLOTH 2 % EX PADS
6.0000 | MEDICATED_PAD | Freq: Once | CUTANEOUS | Status: DC
  Administered 2024-06-01: 6 via TOPICAL

## 2024-06-01 SURGICAL SUPPLY — 62 items
ALLOGRAFT PERF 16X20 1.6+/-0.4 (Tissue) IMPLANT
BAG DECANTER FOR FLEXI CONT (MISCELLANEOUS) ×4 IMPLANT
BINDER BREAST XLRG (GAUZE/BANDAGES/DRESSINGS) IMPLANT
BLADE SURG 10 STRL SS (BLADE) ×4 IMPLANT
BNDG COHESIVE 4X5 TAN STRL LF (GAUZE/BANDAGES/DRESSINGS) IMPLANT
CANISTER SUCTION 3000ML PPV (SUCTIONS) ×4 IMPLANT
CHLORAPREP W/TINT 26 (MISCELLANEOUS) ×4 IMPLANT
CLIP APPLIE 9.375 MED OPEN (MISCELLANEOUS) ×4 IMPLANT
COVER MAYO STAND STRL (DRAPES) ×4 IMPLANT
COVER SURGICAL LIGHT HANDLE (MISCELLANEOUS) ×4 IMPLANT
DERMABOND ADVANCED .7 DNX12 (GAUZE/BANDAGES/DRESSINGS) ×8 IMPLANT
DRAIN CHANNEL 15F RND FF W/TCR (WOUND CARE) IMPLANT
DRAIN CHANNEL 19F RND (DRAIN) ×8 IMPLANT
DRAPE HALF SHEET 40X57 (DRAPES) ×8 IMPLANT
DRAPE INCISE 23X17 STRL (DRAPES) IMPLANT
DRAPE INCISE IOBAN 66X45 STRL (DRAPES) ×4 IMPLANT
DRAPE INCISE IOBAN 85X60 (DRAPES) ×4 IMPLANT
DRAPE TOP ARMCOVERS (MISCELLANEOUS) ×8 IMPLANT
DRAPE U-SHAPE 76X120 STRL (DRAPES) ×8 IMPLANT
DRAPE UTILITY XL STRL (DRAPES) ×4 IMPLANT
DRAPE WARM FLUID 44X44 (DRAPES) ×4 IMPLANT
DRSG TEGADERM 4X4.75 (GAUZE/BANDAGES/DRESSINGS) ×16 IMPLANT
ELECT BLADE 6.5 EXT (BLADE) ×4 IMPLANT
ELECT CAUTERY BLADE 6.4 (BLADE) ×4 IMPLANT
ELECT COATED BLADE 2.86 ST (ELECTRODE) ×4 IMPLANT
ELECTRODE BLDE 4.0 EZ CLN MEGD (MISCELLANEOUS) ×4 IMPLANT
ELECTRODE REM PT RTRN 9FT ADLT (ELECTROSURGICAL) ×4 IMPLANT
EVACUATOR SILICONE 100CC (DRAIN) ×8 IMPLANT
EXPANDER TISSUE FV FOURTE 500 (Prosthesis & Implant Plastic) IMPLANT
EXPANDER TISSUE MX FOURTE 400 (Prosthesis & Implant Plastic) IMPLANT
GAUZE PAD ABD 8X10 STRL (GAUZE/BANDAGES/DRESSINGS) ×8 IMPLANT
GLOVE BIO SURGEON STRL SZ 6 (GLOVE) ×12 IMPLANT
GOWN STRL REUS W/ TWL LRG LVL3 (GOWN DISPOSABLE) ×8 IMPLANT
KIT BASIN OR (CUSTOM PROCEDURE TRAY) ×4 IMPLANT
KIT FILL ASEPTIC TRANSFER (MISCELLANEOUS) IMPLANT
KIT TURNOVER KIT B (KITS) ×4 IMPLANT
MARKER SKIN DUAL TIP RULER LAB (MISCELLANEOUS) ×4 IMPLANT
NDL HYPO 25GX1X1/2 BEV (NEEDLE) ×4 IMPLANT
PACK GENERAL/GYN (CUSTOM PROCEDURE TRAY) ×4 IMPLANT
PAD ARMBOARD POSITIONER FOAM (MISCELLANEOUS) ×12 IMPLANT
PENCIL BUTTON HOLSTER BLD 10FT (ELECTRODE) IMPLANT
PIN SAFETY STERILE (MISCELLANEOUS) ×4 IMPLANT
SET COLLECT BLD 21X3/4 12 (NEEDLE) IMPLANT
SOL PREP POV-IOD 4OZ 10% (MISCELLANEOUS) ×4 IMPLANT
SOLN 0.9% NACL POUR BTL 1000ML (IV SOLUTION) ×8 IMPLANT
SPONGE T-LAP 18X18 ~~LOC~~+RFID (SPONGE) IMPLANT
STAPLER SKIN PROX 35W (STAPLE) ×4 IMPLANT
STOCKINETTE IMPERVIOUS 9X36 MD (GAUZE/BANDAGES/DRESSINGS) IMPLANT
SUT CHROMIC 4 0 RB 1X27 (SUTURE) IMPLANT
SUT ETHILON 2 0 FS 18 (SUTURE) ×8 IMPLANT
SUT MNCRL AB 4-0 PS2 18 (SUTURE) ×8 IMPLANT
SUT PDS AB 2-0 CT2 27 (SUTURE) ×8 IMPLANT
SUT VIC AB 3-0 SH 27X BRD (SUTURE) ×8 IMPLANT
SUT VIC AB 4-0 PS2 18 (SUTURE) IMPLANT
SUTURE STRATFX 0 PDS 27 VIOLET (SUTURE) IMPLANT
SYR 50ML LL SCALE MARK (SYRINGE) ×4 IMPLANT
SYR 50ML SLIP (SYRINGE) IMPLANT
SYR BULB IRRIG 60ML STRL (SYRINGE) ×4 IMPLANT
SYR CONTROL 10ML LL (SYRINGE) ×4 IMPLANT
TOWEL GREEN STERILE (TOWEL DISPOSABLE) ×4 IMPLANT
TRAY FOLEY MTR SLVR 14FR STAT (SET/KITS/TRAYS/PACK) ×4 IMPLANT
TUBE CONNECTING 12X1/4 (SUCTIONS) ×4 IMPLANT

## 2024-06-01 NOTE — Op Note (Signed)
 Operative Note   DATE OF OPERATION: 12.1.2025  LOCATION: [facility]-inpatient  SURGICAL DIVISION: Plastic Surgery  PREOPERATIVE DIAGNOSES:  1. History bilateral breast cancer 2. Acquired absence breasts 3. History therapeutic radiation  POSTOPERATIVE DIAGNOSES:  same  PROCEDURE:  1. Bilateral breast reconstruction with tissue expanders 2. Acellular dermis (Alloderm) to right chest 300 cm2 3. Latissimus dorsi flap to left chest  SURGEON: Earlis Ranks MD MBA  ASSISTANT: IVAR Collier RNFA  ANESTHESIA:  General.   EBL: 50 ml  COMPLICATIONS: None immediate.   INDICATIONS FOR PROCEDURE:  The patient, Megan Rollins, is a 68 y.o. adult born on 07/05/1955, is here for delayed breast reconstruction.    FINDINGS: RIGHT Natrelle 133S-FV-13 T 500 ml tissue expander placed SN [id number] initial fill volume 180 ml saline LEFT 133S-MX-12-T 400 ml SN [id number] initial fill volume 120 ml saline  DESCRIPTION OF PROCEDURE:  The patient's operative site was marked with the patient in the preoperative area. SQ Heparin administered. The patient was taken to the operating room. SCDs were placed and IV antibiotics were given. Foley catheter placed. Patient placed into right lateral position.The patient's operative site was prepped and draped in a sterile fashion. A time out was performed and all information was confirmed to be correct. Incision made in left chest mastectomy scar. Skin flaps elevated over pectoralis muscle. Dissection completed toward left axilla. Incision made over left back. Skin and superficial fascia elevated off surface of latissimus muscle and subcutaneous tunnel dissected to axilla joining anterior breast cavity. Anterior border of latissimus identified and elevated. Muscle divided inferiorly at superior iliac spine. Submuscular dissection completed toward midline back and toward origin. Flap rotated into anterior chest cavity. Back irrigated and hemostasis ensured. 15 Fr drain placed and  secured with 2-0 nylon. 2-0 PDS used to placed quilting sutures from elevated skin flaps to chest wall. Local anesthetic infiltrated. Incision closed with 0 Strattafix suture in superficial fascia and dermis. Skin closure completed with 4-0 monocryl subcuticular and tissue glue applied.   Patient repositioned into supine position. Patient re prepped and draped. Additional time out performed.    Left chest cavity was irrigated with saline solution containing Betadine. Hemostasis was ensured. Thoracodorsal nerve was identified and divided. The latissimus muscle was oriented and sutured to pectoralis muscle and chest wall with 2-0 PDS suture in figure of eight fashion. A 19 Fr drain was placed in breast cavity and secured to skin with 2-0 nylon. The tissue expander was prepared and placed beneath latissimus muscle. The inferior latissimus was secured to superficial fascia and chest wall with 2-0 PDS interrupted.  Skin closure completed with 3-0 vicryl in superficial fascia, 4-0 vicryl in dermis, and 4-0 monocryl subcuticular, and tissue adhesive applied.    Over right chest, incision made in prior mastectomy scar. Skin flaps elevated off pectoralis muscle to dimensions of tissue expander. The left chest cavity was irrigated with saline solution containing Betadine. Hemostasis was ensured. A 19 Fr drain was placed in subcutaneous position laterally and a 15 Fr drain placed along inframammary fold. Each secured to skin with 2-0 nylon. The tissue expander was prepared on back table prior in insertion. The expander was filled with air. Perforated acellular dermis was  draped over anterior surface expander. The ADM was then secured to itself over posterior surface of expander with 4-0 chromic. Redundant folds acellular dermis excised so that the ADM lay flat without folds over air filled expander. Following completion of this, air removed from expander. The expander was secured  to medial insertion pectoralis with a  2-0 PDS. The lateral and superior tabs were also secured to pectoralis muscle with 2-0 PDS. The ADM was secured to pectoralis muscle and chest wall at desired inframammary fold with 0 Strattafix. Skin closure completed with 3-0 vicryl in fascial layer and 4-0 vicryl in dermis. Skin closure completed with 4-0 monocryl subcuticular and tissue adhesive. The expander ports were identified and accessed, total 180 ml saline infiltrated over left chest and 120 ml saline infiltrated over left chest.    Dry dressing applied followed by breast binder. The patient was allowed to wake from anesthesia, extubated and taken to the recovery room in satisfactory condition.   SPECIMENS: none  DRAINS: 15 and 19 Fr JP in right subcutaneous chest, 19 Fr JP in left subcutaneous chest, 15 Fr JP in left subcutaneous back  Earlis Ranks, MD [facility]  Office/ physician access line after hours [phone]

## 2024-06-01 NOTE — Anesthesia Procedure Notes (Signed)
 Procedure Name: Intubation Date/Time: 06/01/2024 7:43 AM  Performed by: Genny Gun, CRNAPre-anesthesia Checklist: Patient identified, Patient being monitored, Timeout performed, Emergency Drugs available and Suction available Patient Re-evaluated:Patient Re-evaluated prior to induction Oxygen Delivery Method: Circle system utilized Preoxygenation: Pre-oxygenation with 100% oxygen Induction Type: IV induction Ventilation: Mask ventilation without difficulty and Oral airway inserted - appropriate to patient size Laryngoscope Size: Mac and 3 Grade View: Grade I Tube type: Oral Tube size: 7.0 mm Number of attempts: 1 Airway Equipment and Method: Stylet Placement Confirmation: ETT inserted through vocal cords under direct vision, positive ETCO2 and breath sounds checked- equal and bilateral Secured at: 21 cm Tube secured with: Tape Dental Injury: Teeth and Oropharynx as per pre-operative assessment

## 2024-06-01 NOTE — Anesthesia Preprocedure Evaluation (Addendum)
 Anesthesia Evaluation  Patient identified by MRN, date of birth, ID band Patient awake    Reviewed: Allergy & Precautions, NPO status , Patient's Chart, lab work & pertinent test results  Airway Mallampati: I  TM Distance: >3 FB Neck ROM: Full    Dental no notable dental hx. (+) Teeth Intact, Dental Advisory Given   Pulmonary former smoker   Pulmonary exam normal breath sounds clear to auscultation       Cardiovascular negative cardio ROS Normal cardiovascular exam Rhythm:Regular Rate:Normal     Neuro/Psych  Headaches PSYCHIATRIC DISORDERS Anxiety Depression       GI/Hepatic negative GI ROS, Neg liver ROS,,,  Endo/Other  negative endocrine ROS    Renal/GU negative Renal ROS  negative genitourinary   Musculoskeletal  (+) Arthritis ,    Abdominal   Peds  Hematology negative hematology ROS (+)   Anesthesia Other Findings   Reproductive/Obstetrics                              Anesthesia Physical Anesthesia Plan  ASA: 2  Anesthesia Plan: General   Post-op Pain Management: Tylenol  PO (pre-op)*   Induction: Intravenous  PONV Risk Score and Plan: 3 and Ondansetron , Dexamethasone , Midazolam  and Treatment may vary due to age or medical condition  Airway Management Planned: Oral ETT  Additional Equipment:   Intra-op Plan:   Post-operative Plan: Extubation in OR  Informed Consent: I have reviewed the patients History and Physical, chart, labs and discussed the procedure including the risks, benefits and alternatives for the proposed anesthesia with the patient or authorized representative who has indicated his/her understanding and acceptance.     Dental advisory given  Plan Discussed with: CRNA  Anesthesia Plan Comments:         Anesthesia Quick Evaluation

## 2024-06-01 NOTE — Transfer of Care (Signed)
 Immediate Anesthesia Transfer of Care Note  Patient: Megan Rollins  Procedure(s) Performed: RECONSTRUCTION, BREAST (Bilateral: Chest) APPLICATION, ACELLULAR DERMAL REPLACEMENT, UPPER EXTREMITY (Right: Chest) RECONSTRUCTION, BREAST, USING LATISSIMUS DORSI MYOCUTANEOUS FLAP (Left: Chest)  Patient Location: PACU  Anesthesia Type:General  Level of Consciousness: drowsy  Airway & Oxygen Therapy: Patient Spontanous Breathing and Patient connected to face mask oxygen  Post-op Assessment: Report given to RN and Post -op Vital signs reviewed and stable  Post vital signs: Reviewed and stable  Last Vitals:  Vitals Value Taken Time  BP 136/51 06/01/24 11:25  Temp    Pulse 62 06/01/24 11:27  Resp 13 06/01/24 11:27  SpO2 92 % 06/01/24 11:27  Vitals shown include unfiled device data.  Last Pain:  Vitals:   06/01/24 0659  TempSrc:   PainSc: 0-No pain         Complications: There were no known notable events for this encounter.

## 2024-06-01 NOTE — Interval H&P Note (Signed)
 History and Physical Interval Note:  06/01/2024 6:43 AM  Megan Rollins  has presented today for surgery, with the diagnosis of history bilateral breast ca acquired absence breasts history therapeutic radiation.  The various methods of treatment have been discussed with the patient and family. After consideration of risks, benefits and other options for treatment, the patient has consented to  bilateral breast reconstruction with tissue expanders acellular dermis to right chest latissimus dorsi flap to left chest as a surgical intervention.  The patient's history has been reviewed, patient examined, no change in status, stable for surgery.  I have reviewed the patient's chart and labs.  Questions were answered to the patient's satisfaction.     Earlis Jagdeep Ancheta

## 2024-06-01 NOTE — Anesthesia Postprocedure Evaluation (Signed)
 Anesthesia Post Note  Patient: Megan Rollins  Procedure(s) Performed: RECONSTRUCTION, BREAST (Bilateral: Chest) APPLICATION, ACELLULAR DERMAL REPLACEMENT, UPPER EXTREMITY (Right: Chest) RECONSTRUCTION, BREAST, USING LATISSIMUS DORSI MYOCUTANEOUS FLAP (Left: Chest)     Patient location during evaluation: PACU Anesthesia Type: General Level of consciousness: awake and alert Pain management: pain level controlled Vital Signs Assessment: post-procedure vital signs reviewed and stable Respiratory status: spontaneous breathing, nonlabored ventilation and respiratory function stable Cardiovascular status: blood pressure returned to baseline and stable Postop Assessment: no apparent nausea or vomiting Anesthetic complications: no   There were no known notable events for this encounter.  Last Vitals:  Vitals:   06/01/24 1200 06/01/24 1215  BP: 132/61 (!) 108/49  Pulse: 63 65  Resp: 13 11  Temp: (!) 35.1 C (!) 35.3 C  SpO2: 93% 93%    Last Pain:  Vitals:   06/01/24 1145  TempSrc:   PainSc: Asleep                 Butler Levander Pinal

## 2024-06-01 NOTE — Plan of Care (Signed)
  Problem: Pain Managment: Goal: General experience of comfort will improve and/or be controlled Outcome: Progressing   Problem: Safety: Goal: Ability to remain free from injury will improve Outcome: Progressing   Problem: Skin Integrity: Goal: Risk for impaired skin integrity will decrease Outcome: Progressing   Problem: Nutrition: Goal: Adequate nutrition will be maintained Outcome: Progressing

## 2024-06-02 ENCOUNTER — Encounter (HOSPITAL_COMMUNITY): Payer: Self-pay | Admitting: Plastic Surgery

## 2024-06-02 NOTE — Progress Notes (Signed)
 Patient suppose to be given pain medication. According to patient and patient relative, the patient took 300mg  Gabapentin  before 11pm.

## 2024-06-02 NOTE — Progress Notes (Signed)
 Discharge Nurse Summary: DC order noted per MD. DC RN at bedside with patient/family. Patient agreeable with discharge plan. AVS printed/reviewed. PIV removed, skin intact. No DME needs. No home/TOC meds. CP/Edu resolved. Telemonitor not present on assessment. All belongings accounted for drain care supplies. Incisions CDI w/o bleeding or drainage. See LDAs. Patient wheeled downstairs for discharge by private auto.   Rosario EMERSON Lund, RN

## 2024-06-02 NOTE — Plan of Care (Signed)
   Problem: Education: Goal: Knowledge of General Education information will improve Description Including pain rating scale, medication(s)/side effects and non-pharmacologic comfort measures Outcome: Progressing

## 2024-06-02 NOTE — Progress Notes (Signed)
 Patient blood pressure is 90/48 with map of 63. Patient pain medication not given. Explained to the patient that giving pain medication would worsen the lowering of blood pressure.

## 2024-06-02 NOTE — Discharge Summary (Signed)
 Physician Discharge Summary  Patient ID: Megan Rollins MRN: [id number] DOB/AGE: [birth year] 67 y.o.  Admit date: 06/01/2024 Discharge date: 06/02/2024  Admission Diagnoses: History breast cancer acquired absence breasts  Discharge Diagnoses:  same   Discharged Condition: stable  Hospital Course: Post operatively patient did well tolerating diet, ambulatory with minimal assist and pain controlled. Reviewed bathing and drain care.   Treatments: surgery: 1. Bilateral breast reconstruction with tissue expanders 2. Acellular dermis (Alloderm) to right chest 300 cm2 3. Latissimus dorsi flap to left chest 12.1.2025  Discharge Exam: Blood pressure (!) 102/57, pulse 69, temperature 98.4 F (36.9 C), temperature source Oral, resp. rate 16, height 5' 8 (1.727 m), weight 83 kg, SpO2 96%. Incision/Wound: Bilateral chest incisions and back incision intact dry, drains serosanguinous, chest and back soft, no hematoma  Disposition: Discharge disposition: 01-Home or Self Care       Discharge Instructions     Call MD for:  redness, tenderness, or signs of infection (pain, swelling, bleeding, redness, odor or green/yellow discharge around incision site)   Complete by: As directed    Call MD for:  temperature >100.5   Complete by: As directed    Discharge instructions   Complete by: As directed    Ok to remove dressings and shower am 12.3.2025. Soap and water ok, pat incisions dry. No creams or ointments over incisions. Do not let drains dangle in shower, attach to lanyard or similar.Strip and record drains twice daily and ring log to clinic visit.  Breast binder or soft compression bra all other times.  Ok to raise arms above shoulders for bathing and dressing.  No house yard work or exercise until cleared by MD.  Recommend ibuprofen with meals to aid with pain control. Also ok to use ice packs to chest for comfort. Recommend Miralax or Dulcolax as needed for constipation. Patient received  all Rx preop.   Driving Restrictions   Complete by: As directed    No driving for 2 weeks then no driving if taking prescription pain medication   Lifting restrictions   Complete by: As directed    No lifting > 5-10 lbs until cleared by MD   Resume previous diet   Complete by: As directed       Allergies as of 06/02/2024       Reactions   Clarithromycin Nausea Only, Other (See Comments)   Copper taste   Penicillins Rash   Sulfa Antibiotics Rash        Medication List     TAKE these medications    alendronate  70 MG tablet Commonly known as: Fosamax  Take 1 tablet (70 mg total) by mouth every 7 (seven) days. Take with a full glass of water on an empty stomach.   Biotin 10 MG Tabs Take 10 mg by mouth daily.   cetirizine 10 MG tablet Commonly known as: ZYRTEC Take 1 tablet by mouth daily.   citalopram  40 MG tablet Commonly known as: CELEXA  Take 1 tablet (40 mg total) by mouth daily. What changed: when to take this   clonazePAM  2 MG tablet Commonly known as: KLONOPIN  Take 1 tablet (2 mg total) by mouth 2 (two) times daily as needed. for anxiety   gabapentin  300 MG capsule Commonly known as: NEURONTIN  TAKE 1 CAPSULE BY MOUTH NIGHTLY AT BEDTIME   multivitamin with minerals Tabs tablet Take 1 tablet by mouth daily.   tiZANidine  2 MG tablet Commonly known as: ZANAFLEX  Take 1 tablet (2 mg total) by mouth at  bedtime.        Follow-up Information     Arelia Filippo, MD Follow up in 1 week(s).   Specialty: Plastic Surgery Why: as scheduled Contact information: [address] [place] [address] [phone]                 Signed: Filippo Arelia 06/02/2024, 7:07 AM

## 2024-06-06 ENCOUNTER — Other Ambulatory Visit: Payer: Self-pay | Admitting: Family

## 2024-07-04 ENCOUNTER — Encounter: Payer: Self-pay | Admitting: Family

## 2024-07-06 ENCOUNTER — Other Ambulatory Visit: Payer: Self-pay

## 2024-07-06 MED ORDER — GABAPENTIN 300 MG PO CAPS: 300.0000 mg | ORAL_CAPSULE | Freq: Every day | ORAL | 1 refills | Status: AC

## 2024-07-31 ENCOUNTER — Encounter: Payer: Self-pay | Admitting: Family

## 2024-07-31 ENCOUNTER — Other Ambulatory Visit: Payer: Self-pay

## 2024-07-31 MED ORDER — TIZANIDINE HCL 2 MG PO TABS: 2.0000 mg | ORAL_TABLET | Freq: Every day | ORAL | 2 refills | Status: AC

## 2024-07-31 NOTE — Assessment & Plan Note (Signed)
 Patient stable.  Well controlled with current therapy.   Continue current meds.

## 2024-08-05 ENCOUNTER — Encounter: Payer: Self-pay | Admitting: *Deleted
